# Patient Record
Sex: Female | Born: 2000 | Race: White | Hispanic: No | Marital: Single | Smoking: Never smoker
Health system: Southern US, Community
[De-identification: ages and names within clinical notes are randomized; demographics above are authoritative.]

## PROBLEM LIST (undated history)

## (undated) DIAGNOSIS — J020 Streptococcal pharyngitis: Secondary | ICD-10-CM

## (undated) DIAGNOSIS — F0781 Postconcussional syndrome: Secondary | ICD-10-CM

## (undated) DIAGNOSIS — F419 Anxiety disorder, unspecified: Secondary | ICD-10-CM

## (undated) DIAGNOSIS — J45909 Unspecified asthma, uncomplicated: Secondary | ICD-10-CM

## (undated) DIAGNOSIS — F32A Depression, unspecified: Secondary | ICD-10-CM

## (undated) HISTORY — DX: Unspecified asthma, uncomplicated: J45.909

## (undated) HISTORY — PX: TONSILLECTOMY: SUR1361

## (undated) HISTORY — DX: Streptococcal pharyngitis: J02.0

## (undated) HISTORY — PX: ADENOIDECTOMY: SUR15

---
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
## 2020-07-01 IMAGING — DX DG CHEST 1V PORT
1 series · 1 of 1 positions shown · non-contrast
Comparison: 07/18/2004

CLINICAL DATA: COVID positive short of breath

EXAM:
PORTABLE CHEST 1 VIEW

[chest ap]
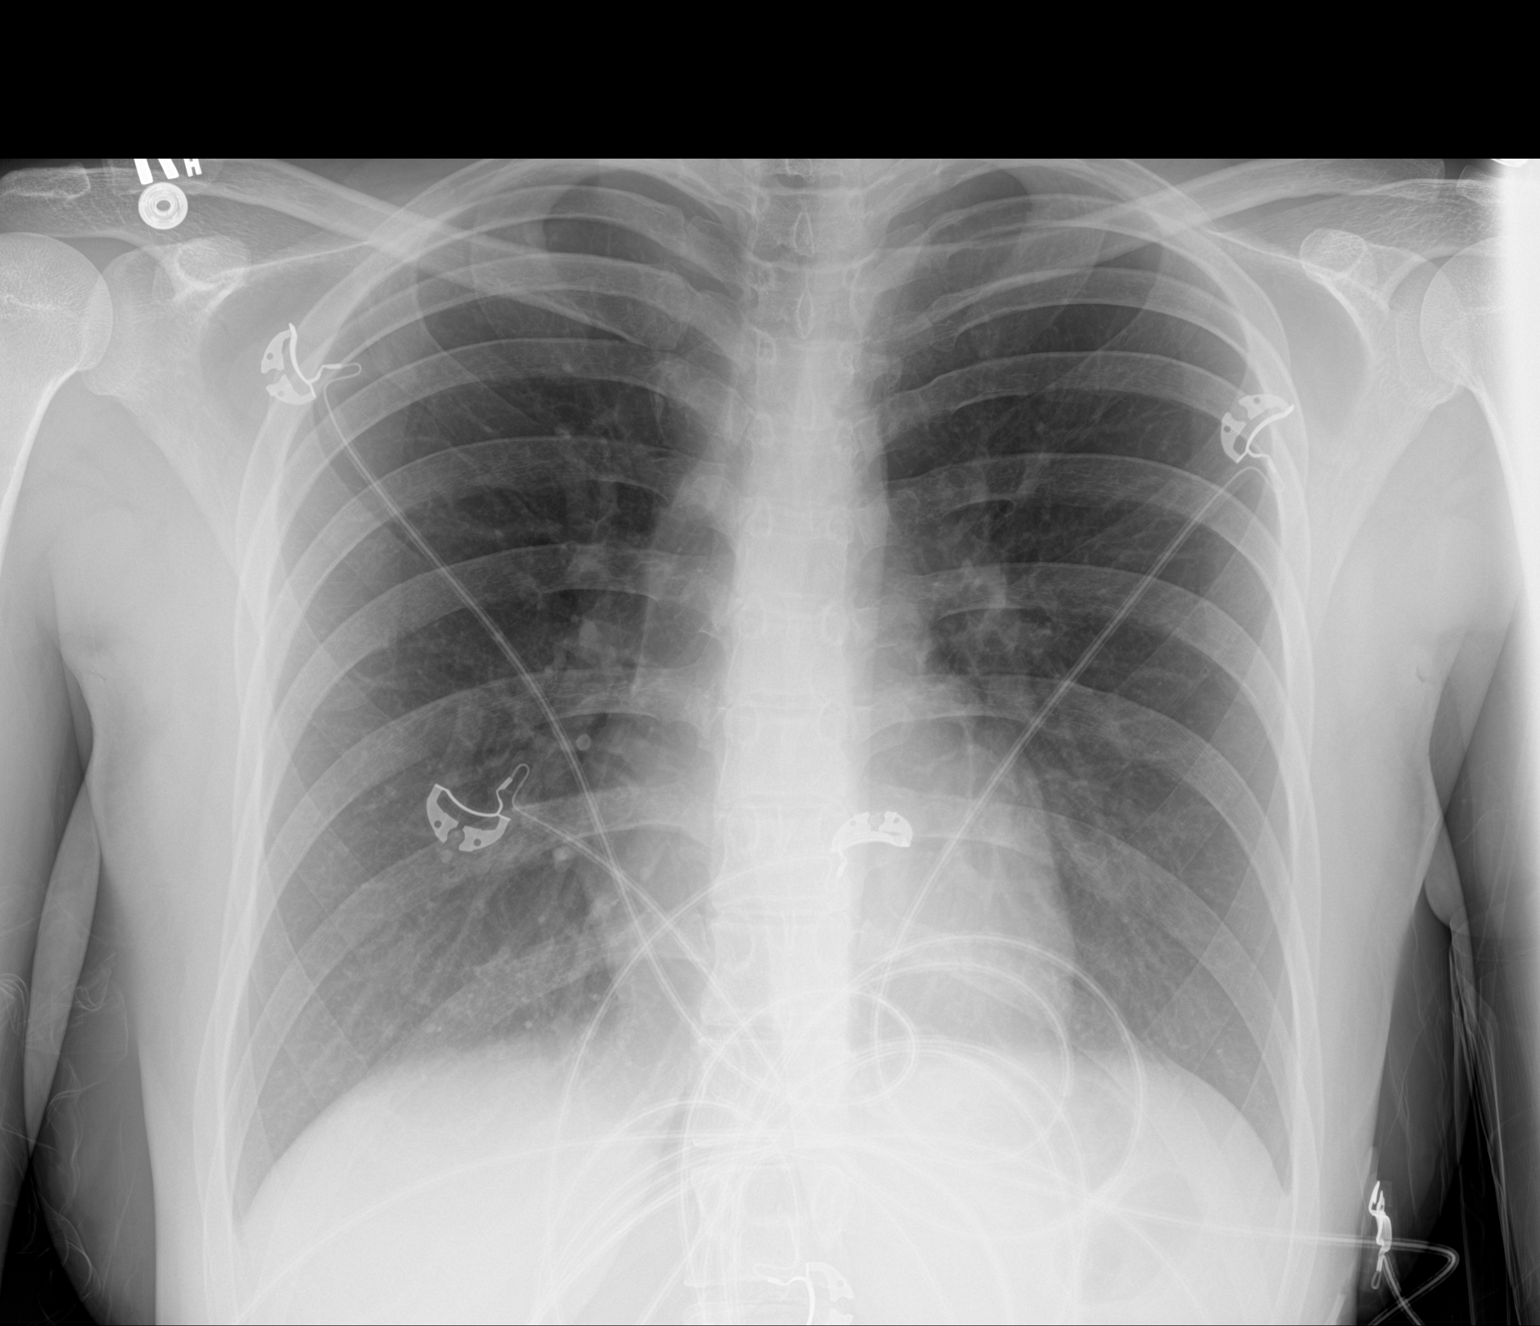

[1 of 1 positions shown; findings below may reference images not displayed]

FINDINGS: The heart size and mediastinal contours are within normal limits.
Both lungs are clear. The visualized skeletal structures are
unremarkable.
IMPRESSION: No active disease.

## 2020-07-10 ENCOUNTER — Encounter: Payer: Self-pay | Admitting: Family Medicine

## 2020-07-10 ENCOUNTER — Ambulatory Visit (INDEPENDENT_AMBULATORY_CARE_PROVIDER_SITE_OTHER): Payer: Managed Care, Other (non HMO) | Admitting: Family Medicine

## 2020-07-10 ENCOUNTER — Other Ambulatory Visit: Payer: Self-pay

## 2020-07-10 VITALS — BP 102/70 | Ht 64.0 in | Wt 130.0 lb

## 2020-07-10 DIAGNOSIS — S060X0A Concussion without loss of consciousness, initial encounter: Secondary | ICD-10-CM | POA: Diagnosis not present

## 2020-07-10 NOTE — Progress Notes (Addendum)
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  Jelesa initially sustained her concussion in early October, about 4 weeks ago, when she was at softball practice and someone accidentally hit her in the face with a softball bat.  It was a fairly traumatic injury and she lost multiple teeth which she had to have surgically reimplanted.  At that time she had an x-ray of her face which did not show any other fractures.  In the days following, she developed headache, nausea and some dizziness.  Initially was unclear whether this was secondary to the tooth injury and subsequent surgery, she continued to have persistent headache.  Since then, she has been managed at [employer] by the athletic training staff and by Dr. Micheline Chapman who has seen her on multiple occasions in the training room.  She has had regular evaluations with scat 5, which have continued to improve very slowly.  She has continued to have symptoms, most significantly headache, emotional lability, and some occasional dizziness.  The headache she describes as pain in the front of her forehead, sometimes radiates to the back but mostly is frontal.  She has had very slow improvement, however over the weekend had a worsening of symptoms which prompted her visit today.  Since then, patient has had return to her previous level of symptoms after resting on Monday and Tuesday.  She continues to have frontal headache and emotional lability which her mother confirms is different from her baseline.  She does not have any known history of depression or anxiety.  She is not on any chronic medications.  Of note, patient does have a history of concussion in high school, she reports that took about 2 months to return to baseline from that as well and that the blow to her head was much more mild than this was.   Review of Systems:  Per HPI.   [place], medications  and smoking status reviewed.      Objective:  Physical Exam:  [place] Adult Exercise 07/10/2020  Frequency of aerobic exercise (# of days/week) 5  Average time in minutes 60  Frequency of strengthening activities (# of days/week) 0     Gen: awake, alert, NAD, comfortable in exam room Pulm: breathing unlabored  Concussion PE  EOMI. PERRLA w/o pain.  H/X test/Smooth Pursuits: WNL, no nystagmus. No symptoms Horizontal Saccades: WNL without undershoot, overshoot. No symptoms.  Vertical Saccades: WNL without undershoot, overshoot. No symptoms.  Balance: Very mild excessive sway Runway Walk: WNL w/o symptoms. Palpation of forehead does elicit some pain, also very mild pain with palpation of cervical spine spinal musculature.    Assessment & Plan:  1.  Concussion  Patient with signs symptoms consistent with concussion.  Differential also includes frontal sinus or facial fracture in the setting of softball bat blow to the face, however x-rays were within normal limits and her symptoms are very consistent with concussion.  Given that she is having a lot of emotional instability as a primary symptom, will plan to refer for CBT.  Also discussed the possibility of PT and vestibular therapy, however it appears that her headache and emotional lability are the most significant symptoms for her so we will start with CBT.  I will plan to see her in training room in 2 weeks for reevaluation, sooner if concerns.   Dagoberto Ligas, MD [facility] Fellow 07/10/2020 3:01 PM  Addendum:  Patient seen in the office by fellow.  His history, exam, plan of care were precepted with me.  Karlton Lemon MD Kirt Boys

## 2020-07-15 ENCOUNTER — Encounter: Payer: Self-pay | Admitting: Psychology

## 2020-07-15 ENCOUNTER — Other Ambulatory Visit: Payer: Self-pay

## 2020-07-15 DIAGNOSIS — S060X0A Concussion without loss of consciousness, initial encounter: Secondary | ICD-10-CM

## 2020-07-25 ENCOUNTER — Encounter: Payer: Self-pay | Admitting: Psychology

## 2020-07-25 ENCOUNTER — Other Ambulatory Visit: Payer: Self-pay

## 2020-07-25 ENCOUNTER — Encounter: Payer: Managed Care, Other (non HMO) | Attending: Psychology | Admitting: Psychology

## 2020-07-25 DIAGNOSIS — F0781 Postconcussional syndrome: Secondary | ICD-10-CM | POA: Diagnosis present

## 2020-07-25 NOTE — Progress Notes (Addendum)
NEUROBEHAVIORAL STATUS EXAM  Name: Susan Matthews Date of Birth: Mar 10, 2001 Date of Interview: 07/25/2020  Reason for Referral:  Susan Matthews is a 19 y.o. female who is referred for psychological treatment (e.g., CBT) by Dr. Barbaraann Barthel of [place] due to disrupted mood and subjective cognitive changes associated with recent concussion; she had another concussion high school. This patient is accompanied in the office by her mother who supplements the history.   History of Presenting Problem: Patient is a 19 y.o. 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   Onset/Course: 06/17/20. Hit in the face/head with softball bat that slipped from the hands of an unknown individual while swinging during a game; she was spectator near [employer]. Lost 2 front teeth. Subsequent surgical reimplantation. X-ray of face performed at dentist did not show fracture.  In the days following, she developed headache, nausea and some dizziness.  Initially was unclear whether this was secondary to the tooth injury and subsequent surgery, she continued to have persistent headache. Managed at [employer] by athletic training staff, including Dr. Micheline Chapman. Balance and SCAT-5 testing have shown slow gradual improvement. She continues to report recurrent headache, emotional lability, and some occasional dizziness.  Pain related to headache is mostly localized to front of forehead and  sometimes radiating towards occipital area; mostly frontal.  She also describes feeling sluggish most days. She does not have any known history of depression or anxiety.  She is not on any chronic medications. She had a prior concussion in high school that reportedly took around 2 months to recover from; described as more mild.   Dr. Buena Irish found patient's signs/symptoms as mostly consistent with concussion. Frontal sinus or facial fracture were considered  but determined as less likely due to normal findings on x-ray. He identified emotional instability as a primary symptom, thus recommended psychological treatment in the form of CBT. Posibility of PT and vestibular therapy was also reportedly discussed with patient.   Upon direct questioning, the patient reported:   Forgetting recent conversations/events: Yes  Repeating statements/questions: Yes  Misplacing/losing items: Yes Forgetting appointments or other obligations: Denied  Forgetting to take medications: Denied   Difficulty concentrating: Yes  Starting but not finishing tasks: Yes; stop when symptoms are activated.  Distracted easily: Yes  Processing information more slowly: Yes  Word-finding difficulty: Denied Word substitutions: Denied Writing difficulty: Denied Spelling difficulty: Denied Comprehension difficulty: Denied  Getting lost when driving: Denied Making wrong turns when driving: Denied Uncertain about directions when driving or passenger: Denied  Family neuro hx: Unknown   Current Functioning: Work:  Regulatory affairs officer   Complex ADLs Driving: Able to perform IND without difficulty Medication management: Able to perform IND without difficulty Management of finances: Able to perform IND without difficulty Appointments: Able to perform IND without difficulty Cooking: Able to perform IND without difficulty  Medical/Physical complaints:  Any hx of stroke/TIA, MI, LOC/TBI, Sz? 2 concussions  Hx falls? Denied Balance, probs walking? Denied Sleep: Insomnia? OSA? CPAP? REM sleep beh sx? Denied Visual illusions/hallucinations? Appetite/Nutrition/Weight changes: Denied   Current mood: Depressed, frequent mood swings   Behavioral disturbance/Personality change: Denied  Suicidal Ideation/Intention: Denied  Psychiatric History: History of depression, anxiety, other MH disorder: Denied History of MH treatment: Denied History of SI: Denied History of  substance dependence/treatment: Denied  Social History: Born/Raised: [place], [place] Education: 60, Currently sophomore in college, estimated GPA 3.85 Occupational history: Ship broker; limited. Worked at [employer]  for 3 years.  Marital history: Single, never married Children: 0 Alcohol: Denied Tobacco: E-cigarette/vape daily SA: Denied  Medical History: Past Medical History:  Diagnosis Date  . Asthma   . Strep throat    Current Medications:  No outpatient encounter medications on file as of 07/25/2020.   No facility-administered encounter medications on file as of 07/25/2020.   Behavioral Observations:   Appearance: Neatly, casually and appropriately dressed and groomed Gait: Ambulated independently, no gross abnormalities observed Speech: Fluent; normal rate, rhythm and volume. No word finding difficulty. Thought process: Linear, goal directed and logical Affect: Full, anxious and depressed Interpersonal: Shy, polite and appropriate   TESTING: There is medical necessity to proceed with neuropsychological assessment as the results will be used to aid in differential diagnosis and clinical decision-making and to inform specific treatment and academic based recommendations. Per the patient, her mother, and medical records reviewed, there has been a change in emotional and cognitive function (e.g., short term memory), that is negatively impacting academic performance.   Clinical Decision Making: In considering the patient's current level of functioning, level of presumed impairment, nature of symptoms, emotional and behavioral responses during the interview, level of literacy, and observed level of motivation, a battery of tests was selected for patient to complete during scheduled testing appointment (08/12/20).    PLAN: The patient will return to complete the above referenced battery of neuropsychological testing with this provider during scheduled testing appointment (08/12/20). Results  will be used to inform selection of therapeutic interventions (e.g., consider strengths and weaknesses) and in developing individualized treatment plan to help return to baseline function.   Education regarding testing procedures was provided to the patient. Subsequently, the patient will see this provider for a follow-up session at which time her test performances and my impressions and treatment recommendations/plan will be reviewed in detail. She will be scheduled for around 8-12 individual therapy sessions. CBT or another evidence-based treatment will be used to help return to emotional and cognitive baseline.   Evaluation ongoing; full report to follow.

## 2020-07-31 NOTE — Addendum Note (Signed)
Addended by: Joelyn Oms R on: 07/31/2020 09:13 PM   Modules accepted: Level of Service

## 2020-08-12 ENCOUNTER — Encounter: Payer: Self-pay | Admitting: Psychology

## 2020-08-12 ENCOUNTER — Encounter: Payer: Managed Care, Other (non HMO) | Attending: Psychology | Admitting: Psychology

## 2020-08-12 ENCOUNTER — Other Ambulatory Visit: Payer: Self-pay

## 2020-08-12 DIAGNOSIS — F4323 Adjustment disorder with mixed anxiety and depressed mood: Secondary | ICD-10-CM | POA: Diagnosis present

## 2020-08-12 DIAGNOSIS — F0781 Postconcussional syndrome: Secondary | ICD-10-CM | POA: Diagnosis present

## 2020-08-12 NOTE — Progress Notes (Signed)
   Neuropsychology Note  Susan Matthews completed 240 minutes of neuropsychological testing with this provider. The patient did not appear overtly distressed by the testing session, per behavioral observation or via self-report. Rest breaks were offered.   Clinical Decision Making: In considering the patient's current level of functioning, level of presumed impairment, nature of symptoms, emotional and behavioral responses during the interview, level of literacy, and observed level of motivation/effort, a battery of tests was selected and completed during today's appointment. Changes were made as deemed necessary based on patient performance on testing, my observations observations and additional pertinent factors such as those listed above.  Susan Matthews will return within approximately 2 weeks for an interactive feedback session with this provider at which time her test performances, clinical impressions and treatment recommendations will be reviewed in detail. The patient understands she can contact our office should she require our assistance before this time.  Full report to follow.

## 2020-08-20 ENCOUNTER — Encounter (HOSPITAL_BASED_OUTPATIENT_CLINIC_OR_DEPARTMENT_OTHER): Payer: Managed Care, Other (non HMO) | Admitting: Psychology

## 2020-08-20 ENCOUNTER — Other Ambulatory Visit: Payer: Self-pay

## 2020-08-20 DIAGNOSIS — F4323 Adjustment disorder with mixed anxiety and depressed mood: Secondary | ICD-10-CM

## 2020-08-20 DIAGNOSIS — F0781 Postconcussional syndrome: Secondary | ICD-10-CM | POA: Diagnosis not present

## 2020-08-22 ENCOUNTER — Encounter: Payer: Self-pay | Admitting: Psychology

## 2020-08-22 ENCOUNTER — Other Ambulatory Visit: Payer: Self-pay

## 2020-08-22 ENCOUNTER — Encounter (HOSPITAL_BASED_OUTPATIENT_CLINIC_OR_DEPARTMENT_OTHER): Payer: Managed Care, Other (non HMO) | Admitting: Psychology

## 2020-08-22 DIAGNOSIS — F4323 Adjustment disorder with mixed anxiety and depressed mood: Secondary | ICD-10-CM

## 2020-08-22 DIAGNOSIS — F0781 Postconcussional syndrome: Secondary | ICD-10-CM | POA: Diagnosis not present

## 2020-08-22 NOTE — Progress Notes (Addendum)
[facility]    Alfonso Ellis, PsyD,  Clinical Neuropsychologist [address], [place], [place]  [address] Phone: [phone] Fax: [phone]   [place]! If you are not the patient, the patient's legal guardian/caregiver, or an individual for whom the patient has signed a release of information then do not proceed and contact the above mentioned provider. Thank you!   PATIENT:    Susan Matthews DATE OF BIRTH:   November 26, 2000 PROCEDURE:  Neuropsychological evaluation  DATE OF SERVICE:   08/20/2020 REFERRAL SOURCE:  Karlton Lemon, MD MEDICAL NECESSITY:  To evaluate cognitive and emotional functioning in light of recent concussion and subjective memory changes. . To monitor treatment efficacy and/or to assist with the management of the patient (i.e., start or continue rehab or pharmacological therapy)  SOURCES OF INFORMATION: The following information was gathered from a clinical interview with patient and mother, as well as review of available medical records. The patient expressed understanding of the purpose of the evaluation and consented to all procedures.   HISTORY OF PRESENT ILLNESS:   Reason for Referral:  Susan Matthews is a 18 y.o. female who was initially referred for psychological treatment (e.g., CBT) by Dr. Barbaraann Barthel of [place] due to disrupted mood and subjective cognitive changes associated with recent concussion; she had another concussion high school.   Given history of repeated head trauma and residual cognitive complaints including subjective memory changes, cognitive testing was deemed necessary to inform treatment planning and overall management of patient.    History of Presenting Problem: Patient is a19 [place].o[place] to initiate psychological treatment after recent  concussion.  Onset/Course: 06/17/20. Hit in the face/head with softball bat that slipped from the hands of an unknown individual while swinging during a game; she was spectator near [employer]. Lost 2 front teeth. Subsequent surgical reimplantation. X-ray of face performed at dentist did not show fracture. In the days following, she developed headache, nausea and some dizziness. Initially was unclear whether this was secondary to the tooth injury and subsequent surgery, she continued to have persistent headache. Managed at [employer] by athletic training staff, including Dr. Micheline Chapman. Balance and SCAT-5 testing have shown slow gradual improvement. She continues to report recurrent headache, emotional lability, and some occasional dizziness. Pain related to headache is mostly localized to front of forehead and  sometimes radiating towards occipital area; mostly frontal. She also describes feeling sluggish most days. She does not have any known history of depression or anxiety. She is not on any chronic medications. She had a prior concussion in high school that reportedly took around 2 months to recover from; described as more mild.   Dr. Buena Irish found patient's signs/symptoms as mostly consistent with concussion. Frontal sinus or facial fracture were considered but determined as less likely due to normal findings on x-ray. He identified emotional instability as a primary symptom, thus recommended psychological treatment in the form of CBT. Posibility of PT and vestibular therapy was also reportedly discussed with patient.   MEDICAL HISTORY: Birth and developmental history are benign.  Medical History:     Past Medical History:  Diagnosis Date  . Asthma   . Strep throat    Current Medications:  No outpatient encounter medications on file as of 08/20/2020.   PSYCHIATRIC HISTORY: Susan Matthews denied ever being diagnosed with or treated for a mental health disorder in the past.  However,  she did endorse experiencing prolonged periods of depression and anxiety in the past. .   There is no reported history of alcohol or illicit substance abuse/dependence. No suicidal/homicidal ideation, plan or intent endorsed. No manic or hypomanic episodes were reported. The patient denied ever experiencing any auditory/visual hallucinations. No behavioral or personality changes were endorsed.  FAMILY HISTORY:   They were unaware of any family history of neurological or movement disorders, neurodegenerative conditions, substance abuse, or psychiatric issues.  PSYCHOSOCIAL HISTORY:   There are no indications of learning or intellectual disability, or ADHD.    BEHAVIORAL OBSERVATIONS:   Susan Matthews was appropriately dressed for season and situation and appeared tidy and well-groomed. Stature and height were unremarkable. Patient appeared thin, fairly nourished, and chronological age. Sensory and motor abilities appeared normal. Patient was friendly and rapport was established. Speech was as expected. The patient was able to understand test directions. Mood was anxious and affect was mood congruent. Attention was variable and motivation was good. Insight was fair. Optimal test taking conditions were maintained.  VALIDITY OF EVALUATION: Scores on objective and embedded measures of performance validity were within normal limits, and there were no behavioral manifestations that suggested suboptimal effort or poor test engagement. As such, the following test results are considered valid and interpretable.  Mental Status: The patient was alert and fully oriented to person, place, time, and situation.  Attention and concentration were as expected.  Fund of information was typical.  Thinking was goal-directed and appeared normal from the perspective of productivity, relevance, and coherence with no preoccupations. The patient was able to form concepts well.  Judgment and decision-making appeared  intact.  Insight was full.    TEST RESULTS:   A detailed score summary is provided within the Scores Summary Table at the end of this report, presented as standardized scores obtained through comparisons of the patient's performance to that of same age peers taking into account (whenever possible) the effects of age, education, and other demographic factors.   Baseline Intellectual Abilities: Performance on a measure of word reading combined with demographic information yielded a statistically-derived estimate of premorbid intellectual functioning.  The patient's premorbid abilities are estimated to be in the below average to average range.   Attention, Processing Speed, and Executive Cognitive Processes: Response inhibition was relatively impaired as she made an excessive amount of errors (7 omissions). Performance improved when a switching component was added including less errors. Word naming speed was average. Color naming speed was average. Visuomotor cognitive information processing speed ranged from below average to average. Immediate auditory attention was mildly impaired and a notable weakness, working memory capacity was average, and mental sequencing was below average. Visual working memory was average. Sustained visual attention was below expectation and mildly impaired. Psychomotor speed involving simple sequencing and visual scanning was below average. More complex sequencing requiring divided attention, mental flexibility, and set shifting was mildly impaired. The patient's ability to identify, maintain, and shift problem solving strategies in response to examiner feedback was average.   Language Functions: The patient's speech was fluent and grammar and syntax were unremarkable. No major word-finding  problems were observed. Speech comprehension appeared intact. Letter fluency was below average. Category fluency was average.  Visuospatial and Constructional Abilities: Spatial judgment was  average. Complex constructional praxis was intact.   Learning and Memory:  On a rote verbal list-learning task, overall verbal learning was above average. Short delayed free recall was average. Long-delayed free recall was above average.Verbal recognition was also above  average. Immediate recall of auditorially-processed prose material was average. Long-delayed contextual free recall was also average; recognition was average. On a test measuring visuospatial memory, immediate and long-delayed free recall were above average. Visual recognition was average.   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 She rated trouble falling asleep, and difficulty remembering as her most severe symptoms.  Difficulty concentrating, nervousness, headaches, fatigue, loss of sleep, and light sensitivity were rated as moderate. Dizziness, drowsiness, feeling slow, and visual problems were rated as mild.    SUMMARY & IMPRESSION:   Clinical Impressions: Postconcussional syndrome;   Results of cognitive testing are generally within normal limits and consistent with predicted level of functioning. Relative weaknesses, which may represent mild decrements from premorbid functioning, are noted in basic auditory attention, vigilance, and inhibition.  Mild-moderate residual symptoms associated with  postconcussional syndrome were endorsed. Based on the testing results, there is no basis for a diagnosis of a cognitive disorder. However, there does appear to be a slight (non-statistically significant) pattern of weakness suggesting left frontal area might be more impacted given mildly impaired fine motor coordination in dominant hand (Rt.), decreased lexical fluency, and basic attention; to be explored further. Nevertheless, the constellation of subjective cognitive complaints, physiological symptoms and  emotional changes following her concussion is consistent with a diagnosis of postconcussional syndrome (PCS). While her neuropsychological performance does not necessarily rule out the possibility of some mild and/or transient cognitive problems, it does provide strong evidence against any very significant impairment. Symptoms of anxiety and depression are likely contributing to her functional cognitive complaints to a mild-moderate degree. Thus, I am hopeful that intervention for mood, as well as education regarding PCS, will improve her overall wellbeing.   Recommendations/Plan: 1. TREATMENT FOR MOOD/ANXIETY:  CBT targeted at postconcussional syndrome symptoms is specifically recommended Cecille Rubin, S., & Owens Shark, R. (2012).  Therein the patient can work on identifying triggers for depression and anxiety as well as work towards developing healthy coping mechanisms and implementing lifestyle modifications that can help to support ongoing mental health.    2.          OPTOMETRY OR OPHTHALMOLOGY: Given some evidence for residual vision problems and symptoms following concussion (e.g., headaches, dizziness, sensitivity to light, blurry vision), she may benefit from an optometry or ophthalmology for a comprehensive eye exam to determine whether vision therapy is recommended. If deemed appropriate targeted visual (oculomotor) exercises may help repair damaged connections, returning them to normal.   3. EDUCATION REGARDING PCS: The patient will be educated regarding PCS and the importance of treating depression/anxiety and improving coping skills for other symptoms. She will be provided with detailed, written information regarding PCS.   4. Nutritional factors can have a significant effect on psychological and emotional status, as well as overall brain functioning.  The following general recommendations have been associated with improvements in depression and other psychological symptoms, as well as lower risk  for other forms of cognitive impairment.  Please discuss these recommendations with your physician and/or dietitian before initiating:  . Consume a wide variety of fresh fruits and vegetables, particularly including brightly colored items such as berries, oranges, tomatoes, peppers, carrots, broccoli, spinach, dark green lettuces, sweet potatoes, etc., all of which are high in vitamins and antioxidants.  . Consume foods that are high in fiber, such as legumes (e.g., beans, peas, lentils) and foods made from whole grains (e.g., whole wheat  bread and pasta)  . Consume a significant amount of omega-3 essential fats and oils.  These can be found in natural food sources such as salmon and other fatty fish, and also products made from flax seed and flax seed oil.  Alternately, dietary supplementation with fish oil capsules and flax seed oil capsules is a good way to boost one's level of omega-3 consumption.  It is important to check with your doctor before taking these supplements, especially if you take blood thinning medication.  . If you do not already do so, consider taking a quality multivitamin supplement under the guidance of your primary care physician.   . Consider keeping consumption of the following foods to a minimum: 1) foods made from white flour and white sugar; 2) artificial sweeteners; 3) deep-fried foods; 4) animal fat other than fish; 5) any foods containing "hydrogenated" or "trans" fats; 6) most other types of highly processed packaged/prepared foods.   5. The following are several strategies that may help:  . Performance will generally be best in a structured, routine, and familiar environment, as opposed to situations involving complex problems.  Marlene Lard a place to keep your keys, wallet, cell phone, and other personal belongings. . Take time to register and process information to be remembered. Deeper encoding of information can be gained by forming a mental picture, making  meaningful associations, connecting new information to previously learned and related information, paraphrasing and repetition.  . To the extent possible, multitasking should be avoided; break down tasks into smaller steps to help get started and to keep from feeling overwhelmed. And if there are difficulties in organization and planning, maintaining a daily organizer to help keep track of important appointments and information may be beneficial.   . Memory problems may at least be minimally addressed using compensatory strategies such as the use of a daily schedule to follow, memos, portable recorder, a centrally located bulletin board, or memory notebook. A large calendar, placed in a highly visible location would be valuable to keep track of dates and appointments.  In addition, it would be helpful to keep a log of all of medical appointments with the name of the doctor, date of visit, diagnoses, and treatments.  . Use of a medication box is recommended to ensure compliance and decrease confusion regarding medication dosages, times, and dates. . To aid in managing problems with attention, the patient may consider using some of the following strategies: o The patient should simplify tasks.  There may be a need to break overly complex activities into simple step-by-step tasks, keep these steps written down in a note book and then check them off as they are completed which will help to stay on task and make sure the whole task is finished.  o The patient should set deadlines for everything, even for seemingly small tasks, prioritize time-sensitive tasks and write down every assignment, message, or important thought. o The patient is encouraged to use timers and alarms to stay on track and take breaks at regular intervals. Avoid piles of paperwork or procrastination by dealing with each item as it comes in.  6. Considering the relative weakness noted in basic attention, aspects of executive function, fine  motor coordination, and residual PCS symptoms that interfere with cognition, documented in this evaluation, it is recommended that Susan Matthews be given an extension on all final projects and exams associated with the current semester (Winter 2021). She should also receive standard accommodations upon her return to college , including  specific accommodations such as extra time for examinations, permission to take exams alone in a quiet place, permission to tape-record lectures, provision of outlines of material to be covered prior to lectures, etc.  If you have any questions, please contact us at [phone].   This report is intended solely for the confidential review and use by the referring professional to assist in diagnostic and medical decision making needs.  This report should not be released to a third party without proper consent. [NOTE: data can be made available to qualified professionals with permission from the patient or legal representative/caregiver]   REFERRING DIAGNOSIS:  Memory changes  FINAL DIAGNOSES (ICD-10 considerations):  Post-concussional syndrome [F07.81]; Adjustment disorder with mixed anxiety and depressed mood [F43.23]   ____________________________________ Alfonso Ellis, PsyD,  Licensed Psychologist (Provisional) Clinical Neuropsychologist     TEST SCORES:  Note: This summary of test scores accompanies the interpretive report and should not be considered in isolation without reference to the appropriate sections in the text. Descriptors are based on appropriate normative data and may be adjusted based on clinical judgment. The terms "impaired" and "within normal limits (WNL)" are used when a more specific level of functioning cannot be determined.    Validity Testing:     Descriptor         WAIS-IV Reliable Digit Span: --- --- Within Expectation  CVLT-III Forced Choice Recognition: --- --- -  RCFT Combination Effort Score: --- --- Within Expectation          Intellectual Functioning:                 Standard Score Percentile    Test of Premorbid Functioning: 86 18 Below Average         Wechsler Adult Intelligence Scale (WAIS-IV):  Standard Score/ Scaled Score Percentile    Verbal Comprehension Index: - - -  Similarities  8 25 Average  Information  6 9 Below Average  Perceptual Reasoning Index:  - - -  Matrix Reasoning  8 25 Average  Visual Puzzles 7 16 Below Average  Working Memory Index: - - -  Digit Span 7 16 Below Average  Processing Speed Index: 86 18 Below Average  Symbol Search  7 16 Below Average  Coding 8 25 Average         Wechsler Adult Intelligence Scale (WAIS-IV) Short Form*: Standard Score/ Scaled Score Percentile    Full Scale IQ  81 10 Below Average  Similarities 8 25 Average  Information  6 9 Below Average  Visual Puzzles 7 16 Below Average  Digit Span 7 16 Below Average  Coding 8 25 Average  *From [employer] (2009)             Memory:               Wechsler Memory Scale (WMS-IV):                       Raw Score (Scaled Score) Percentile    Logical Memory I 29/50 (11) 63 Average  Logical Memory II 26/50 (11) 63 Average  Logical Memory Recognition 26/30 51-75 Average         [place] Verbal Learning Test (CVLT-III), Standard Form: Raw Score (Scaled/Standard Score) Percentile    Total Trials 1-5 63/80 (115) 84 Above Average  List B 7/16 (13) 84 Above Average  Short-Delay Free Recall 12/16 (11) 63 Average  Short-Delay Cued Recall 12/16 (10) 50 Average  Long Delay Free  Recall 14/16 (13) 84 Above Average  Long Delay Cued Recall 14/16 (13) 84 Above Average  Discriminability 4.0 (13) 84 Above Average  Recognition Hits 16/16 (13) 84 Above Average  False Positive Errors  (12) 75 Above Average         Rey-Osterrieth Complex Figure Test (RCFT): Raw Score (T Score) Percentile    Immediate Recall 28.5/36 (59) 82 Above Average  Delayed Recall 29/36 (59) 82 Above Average  Recognition Total Correct  22/24 (52) 58 Average  True Positives 10 >16 Within Normal Limits  False Positives 0 >16 Within Normal Limits         Attention/Executive Function:               Trail Making Test (TMT): Raw Score (T-Score) Percentile    Part A 29 secs., 0 errors (38) 12 Below Average  Part B 82 secs., 1error (35) 7 Mildly Impaired            Scaled Score Percentile    WAIS-IV Coding: 8 25 Average           Scaled Score Percentile    WAIS-IV Digit Span: 7 16 Below Average  Forward 5 5 Well Below Average  Backward 9 37 Average  Sequencing 7 16 Below Average         CPT T Score Percentile    Hits 135 - Average  Misses 13 - Below Expectation  Omissions 15 - Below Expectation  Variability - - Below Expectation           Age-Scaled Score Percentile    Wechsler Memory Scale (WMS-IV) Symbol Span: 8 25 Average           Scaled Score Percentile    WAIS-IV Similarities: 8 25 Average         D-KEFS Color-Word Interference Test: Scaled Score Percentile    Color Naming  11 63 Average  Word Reading  11 63 Average  Inhibition 12 75 Average  Total Errors 7 errors (3) 1 Exceptionally Low  Inhibition/Switching 14 91 Above Average  Total Errors 4 errors (9) 37 Average         [place] Card Sorting Test: Raw Score Percentile    Categories (trials) 6 (12) - >16  Total Errors 18 66 Average  % Perseverative Errors 10 50 Average  % Non-Perseverative Errors 8 62 Average  Failure to Maintain Set 0 - >16         Language:               Verbal Fluency Test: Raw Score (T-Score) Percentile    Phonemic Fluency (FAS) 34 (39) 14 Below Average  Animal Fluency 28 (56) 73 Average         Visuospatial/Visuoconstruction:          Raw Score Percentile    RCFT, Copy: 36/36 >16 Within Normal Limits    Scaled Score Percentile    WAIS-IV Matrix Reasoning: 8 25 Average   WAIS-IV Visual Puzzles 8 25 Average       Sensory-Motor:               [place] Grooved Pegboard Test: Raw Score (T-Score) Percentile     Dominant Hand (Rt.) 73 secs., 0 drops (33)  5 Mildly Impaired  Non-Dominant Hand 68 secs., 1 drops (42)  21 Average         Additional Questionnaires:               Post-Concussion Symptom Scale: Raw Score  Percentile    Total Score 38/132 --- Mild-Moderate  Headaches 3 --- Moderate  Nausea 0 --- None  Balance Problems 0 --- None  Dizziness 2 --- Mild  Fatigue 3 --- Moderate  Trouble Falling to Sleep 4 --- Moderate  Excessive Sleep 0 --- None  Loss of Sleep 3 --- Moderate  Drowsiness  2 --- Mild  Light Sensitivity 3 --- Moderate  Noise Sensitivity 0 --- None  Irritability 1 --- Mild  Sadness 1 --- Mild  Nervousness 3 --- Moderate  More Emotional 1 --- Mild  Numbness 0 --- None  Feeling "Slow" 2  --- Mild  Feeling "Foggy" 1 --- Mild  Difficulty Concentration  3 --- Moderate  Difficulty Remembering  4 --- Moderate  Visual Problems 2 --- Mild          Billing/Service Summary:   Neurobehavioral Status Exam:  Base: [id number] Add-on: [id number]  Direct clinical assessment (interview) of the patient and collateral interviews (as appropriate) by the licensed psychologist                                                                                      Total time: 120 minutes       Total units:  1 1  Neuropsychological Testing Evaluation Services:   Base: [id number] Add-on: [id number]  Records review & clarify referral question; Patient symptom management; clinical decision making/battery modification; Integration/report generation; and, post-service work   Total time:  120 minutes  Total units:  1 1  Designer, fashion/clothing by Psychologist:  Base: [id number] Add-on: [phone]  Test Administration (face-to-face) Scoring (Non-face-to-face)                                                                                       Total time: 240 minutes      Total units:  1 7

## 2020-08-23 NOTE — Progress Notes (Addendum)
Neuropsychology Feedback Appointment  Susan Matthews and her mother returned for a feedback appointment today to review the results of her recent neuropsychological evaluation with this provider. 60 minutes face-to-face time was spent reviewing her test results, my impressions and my recommendations as detailed in her report. Education was provided about postconcussional syndrome and adjustment disorder with mixed anxiety and depressed mood. The patient and her mother were given the opportunity to ask questions, and I did my best to answer these to their satisfaction.    We scheduled first CBT session for 09/05/20. Plan is to help patient develop the ability to identify negative thought patterns that contribute to her individual difficulties and teache concrete skills that she can use to help manage them. Anticipated length of treatment is 8-12 individual therapy sessions (60min); will assess progress every 4 visits.  Below you can find the summary and impressions/diagnoses from the formal evaluation.  The complete report can be found in the patient's medical records dated 08/20/2020 and the full history and initial intake information can be found dated 07/25/2020.   TEST RESULTS:   A detailed score summary is provided within the Scores Summary Table at the end of this report, presented as standardized scores obtained through comparisons of the patient's performance to that of same age peers taking into account (whenever possible) the effects of age, education, and other demographic factors.   Baseline Intellectual Abilities: Performance on a measure of word reading combined with demographic information yielded a statistically-derived estimate of premorbid intellectual functioning.  The patient's premorbid abilities are estimated to be in the below average to average range.   Attention, Processing Speed, and Executive Cognitive Processes: Response inhibition was relatively impaired as she  made an excessive amount of errors (7 omissions). Performance improved when a switching component was added including less errors. Word naming speed was average. Color naming speed was average. Visuomotor cognitive information processing speed ranged from below average to average. Immediate auditory attention was mildly impaired and a notable weakness, working memory capacity was average, and mental sequencing was below average. Visual working memory was average. Sustained visual attention was below expectation and mildly impaired. Psychomotor speed involving simple sequencing and visual scanning was below average. More complex sequencing requiring divided attention, mental flexibility, and set shifting was mildly impaired. The patient's ability to identify, maintain, and shift problem solving strategies in response to examiner feedback was average.   Language Functions: The patient's speech was fluent and grammar and syntax were unremarkable. No major word-finding  problems were observed. Speech comprehension appeared intact. Letter fluency was below average. Category fluency was average.  Visuospatial and Constructional Abilities: Spatial judgment was average. Complex constructional praxis was intact.   Learning and Memory: On a rote verbal list-learning task, overall verbal learning was above average. Short delayed free recall was average. Long-delayed free recall was above average.Verbal recognition was also above average. Immediate recall of auditorially-processed prose material was average. Long-delayed contextual free recall was also average; recognition was average. On a test measuring visuospatial memory, immediate and long-delayed free recall were above average. Visual recognition was average.   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 She rated trouble falling  asleep, and difficulty remembering as her most severe symptoms.  Difficulty concentrating, nervousness, headaches, fatigue, loss of sleep, and light sensitivity were rated as moderate. Dizziness, drowsiness, feeling slow, and visual problems were rated as mild.  SUMMARY & IMPRESSION:   Clinical Impressions: Postconcussional syndrome;  Adjustment disorder with mixed anxiety and depressed mood  Results of cognitive testing are generally within normal limits and consistent with predicted level of functioning. Relative weaknesses, which may represent mild decrements from premorbid functioning, are noted in basic auditory attention, vigilance, and inhibition.  Mild-moderate residual symptoms associated with  postconcussional syndrome were endorsed. Based on the testing results, there is no basis for a diagnosis of a cognitive disorder. However, there does appear to be a slight (non-statistically significant) pattern of weakness suggesting left frontal area might be more impacted given mildly impaired fine motor coordination in dominant hand (Rt.), decreased lexical fluency, and basic attention; to be explored further. Nevertheless, the constellation of subjective cognitive complaints, physiological symptoms and emotional changes following her concussion is consistent with a diagnosis of postconcussional syndrome (PCS). While her neuropsychological performance does not necessarily rule out the possibility of some mild and/or transient cognitive problems, it does provide strong evidence against any very significant impairment. Symptoms of anxiety and depression are likely contributing to her functional cognitive complaints to a mild-moderate degree. Thus, I am hopeful that intervention for mood, as well as education regarding PCS, will improve her overall wellbeing.   Recommendations/Plan: 1. TREATMENT FOR MOOD/ANXIETY:  CBT targeted at postconcussional syndrome symptoms is specifically recommended Cecille Rubin,  S., & Owens Shark, R. (2012).  Therein the patient can work on identifying triggers for depression and anxiety as well as work towards developing healthy coping mechanisms and implementing lifestyle modifications that can help to support ongoing mental health.    2. OPTOMETRY OR OPHTHALMOLOGY: Given some evidence for residual vision problems and symptoms following concussion (e.g., headaches, dizziness, sensitivity to light, blurry vision), she may benefit from an optometry or ophthalmology for a comprehensive eye exam to determine whether vision therapy is recommended. If deemed appropriate targeted visual (oculomotor) exercises may help repair damaged connections, returning them to normal.   3. EDUCATION REGARDING PCS: The patient will be educated regarding PCS and the importance of treating depression/anxiety and improving coping skills for other symptoms. She will be provided with detailed, written information regarding PCS.   4. Nutritional factors can have a significant effect on psychological and emotional status, as well as overall brain functioning.  The following general recommendations have been associated with improvements in depression and other psychological symptoms, as well as lower risk for other forms of cognitive impairment.  Please discuss these recommendations with your physician and/or dietitian before initiating:   . Consume a wide variety of fresh fruits and vegetables, particularly including brightly colored items such as berries, oranges, tomatoes, peppers, carrots, broccoli, spinach, dark green lettuces, sweet potatoes, etc., all of which are high in vitamins and antioxidants.  . Consume foods that are high in fiber, such as legumes (e.g., beans, peas, lentils) and foods made from whole grains (e.g., whole wheat bread and pasta)  . Consume a significant amount of omega-3 essential fats and oils.  These can be found in natural food sources such as salmon and other fatty fish, and also  products made from flax seed and flax seed oil.  Alternately, dietary supplementation with fish oil capsules and flax seed oil capsules is a good way to boost one's level of omega-3 consumption.  It is important to check with your doctor before taking these supplements, especially if you take blood thinning medication.  . If you do not already do so, consider taking a quality multivitamin supplement under the guidance of your  primary care physician.   . Consider keeping consumption of the following foods to a minimum: 1) foods made from white flour and white sugar; 2) artificial sweeteners; 3) deep-fried foods; 4) animal fat other than fish; 5) any foods containing "hydrogenated" or "trans" fats; 6) most other types of highly processed packaged/prepared foods.   5. The following are several strategies that may help:  . Performance will generally be best in a structured, routine, and familiar environment, as opposed to situations involving complex problems.  Marlene Lard a place to keep your keys, wallet, cell phone, and other personal belongings. . Take time to register and process information to be remembered. Deeper encoding of information can be gained by forming a mental picture, making meaningful associations, connecting new information to previously learned and related information, paraphrasing and repetition.  . To the extent possible, multitasking should be avoided; break down tasks into smaller steps to help get started and to keep from feeling overwhelmed. And if there are difficulties in organization and planning, maintaining a daily organizer to help keep track of important appointments and information may be beneficial.   . Memory problems may at least be minimally addressed using compensatory strategies such as the use of a daily schedule to follow, memos, portable recorder, a centrally located bulletin board, or memory notebook. A large calendar, placed in a highly visible location would be  valuable to keep track of dates and appointments.  In addition, it would be helpful to keep a log of all of medical appointments with the name of the doctor, date of visit, diagnoses, and treatments.  . Use of a medication box is recommended to ensure compliance and decrease confusion regarding medication dosages, times, and dates. . To aid in managing problems with attention, the patient may consider using some of the following strategies: o The patient should simplify tasks.  There may be a need to break overly complex activities into simple step-by-step tasks, keep these steps written down in a note book and then check them off as they are completed which will help to stay on task and make sure the whole task is finished.  o The patient should set deadlines for everything, even for seemingly small tasks, prioritize time-sensitive tasks and write down every assignment, message, or important thought. o The patient is encouraged to use timers and alarms to stay on track and take breaks at regular intervals. Avoid piles of paperwork or procrastination by dealing with each item as it comes in.  6. Considering the relative weakness noted in basic attention, aspects of executive function, fine motor coordination, and residual PCS symptoms that interfere with cognition, documented in this evaluation, it is recommended that Ms. Villescas be given an extension on all final projects and exams associated with the current semester (Winter, 2021). She should also receive standard accommodations upon her return to college , including specific accommodations such as extra time for examinations, permission to take exams alone in a quiet place, permission to tape-record lectures, provision of outlines of material to be covered prior to lectures, etc.  If you have any questions, please contact us at [phone].   This report is intended solely for the confidential review and use by the referring professional to  assist in diagnostic and medical decision making needs.  This report should not be released to a third party without proper consent. [NOTE: data can be made available to qualified professionals with permission from the patient or legal representative/caregiver]   REFERRING DIAGNOSIS:  Memory changes  FINAL DIAGNOSES (ICD-10 considerations):  Post-concussional syndrome [F07.81]; Adjustment disorder with mixed anxiety and depressed mood [F43.23]   ____________________________________ Alfonso Ellis, PsyD,  Licensed Psychologist (Provisional) Clinical Neuropsychologist     TEST SCORES:  Note: This summary of test scores accompanies the interpretive report and should not be considered in isolation without reference to the appropriate sections in the text. Descriptors are based on appropriate normative data and may be adjusted based on clinical judgment. The terms "impaired" and "within normal limits (WNL)" are used when a more specific level of functioning cannot be determined.    Validity Testing:     Descriptor         WAIS-IV Reliable Digit Span: --- --- Within Expectation  CVLT-III Forced Choice Recognition: --- --- -  RCFT Combination Effort Score: --- --- Within Expectation         Intellectual Functioning:                 Standard Score Percentile    Test of Premorbid Functioning: 86 18 Below Average         Wechsler Adult Intelligence Scale (WAIS-IV):  Standard Score/ Scaled Score Percentile    Verbal Comprehension Index: - - -  Similarities  8 25 Average  Information  6 9 Below Average  Perceptual Reasoning Index:  - - -  Matrix Reasoning  8 25 Average  Visual Puzzles 7 16 Below Average  Working Memory Index: - - -  Digit Span 7 16 Below Average  Processing Speed Index: 86 18 Below Average  Symbol Search  7 16 Below Average  Coding 8 25 Average         Wechsler Adult Intelligence Scale (WAIS-IV) Short Form*: Standard Score/ Scaled Score Percentile    Full Scale  IQ  81 10 Below Average  Similarities 8 25 Average  Information  6 9 Below Average  Visual Puzzles 7 16 Below Average  Digit Span 7 16 Below Average  Coding 8 25 Average  *From [employer] (2009)             Memory:               Wechsler Memory Scale (WMS-IV):                       Raw Score (Scaled Score) Percentile    Logical Memory I 29/50 (11) 63 Average  Logical Memory II 26/50 (11) 63 Average  Logical Memory Recognition 26/30 51-75 Average         [place] Verbal Learning Test (CVLT-III), Standard Form: Raw Score (Scaled/Standard Score) Percentile    Total Trials 1-5 63/80 (115) 84 Above Average  List B 7/16 (13) 84 Above Average  Short-Delay Free Recall 12/16 (11) 63 Average  Short-Delay Cued Recall 12/16 (10) 50 Average  Long Delay Free Recall 14/16 (13) 84 Above Average  Long Delay Cued Recall 14/16 (13) 84 Above Average  Discriminability 4.0 (13) 84 Above Average  Recognition Hits 16/16 (13) 84 Above Average  False Positive Errors  (12) 75 Above Average         Rey-Osterrieth Complex Figure Test (RCFT): Raw Score (T Score) Percentile    Immediate Recall 28.5/36 (59) 82 Above Average  Delayed Recall 29/36 (59) 82 Above Average  Recognition Total Correct 22/24 (52) 58 Average  True Positives 10 >16 Within Normal Limits  False Positives 0 >16 Within Normal Limits  Attention/Executive Function:               Trail Making Test (TMT): Raw Score (T-Score) Percentile    Part A 29 secs., 0 errors (38) 12 Below Average  Part B 82 secs., 1error (35) 7 Mildly Impaired            Scaled Score Percentile    WAIS-IV Coding: 8 25 Average           Scaled Score Percentile    WAIS-IV Digit Span: 7 16 Below Average  Forward 5 5 Well Below Average  Backward 9 37 Average  Sequencing 7 16 Below Average         CPT T Score Percentile    Hits 135 - Average  Misses 13 - Below Expectation  Omissions 15 - Below Expectation  Variability - - Below Expectation            Age-Scaled Score Percentile    Wechsler Memory Scale (WMS-IV) Symbol Span: 8 25 Average           Scaled Score Percentile    WAIS-IV Similarities: 8 25 Average         D-KEFS Color-Word Interference Test: Scaled Score Percentile    Color Naming  11 63 Average  Word Reading  11 63 Average  Inhibition 12 75 Average  Total Errors 7 errors (3) 1 Exceptionally Low  Inhibition/Switching 14 91 Above Average  Total Errors 4 errors (9) 37 Average         [place] Card Sorting Test: Raw Score Percentile    Categories (trials) 6 (12) - >16  Total Errors 18 66 Average  % Perseverative Errors 10 50 Average  % Non-Perseverative Errors 8 62 Average  Failure to Maintain Set 0 - >16         Language:               Verbal Fluency Test: Raw Score (T-Score) Percentile    Phonemic Fluency (FAS) 34 (39) 14 Below Average  Animal Fluency 28 (56) 73 Average         Visuospatial/Visuoconstruction:          Raw Score Percentile    RCFT, Copy: 36/36 >16 Within Normal Limits    Scaled Score Percentile    WAIS-IV Matrix Reasoning: 8 25 Average   WAIS-IV Visual Puzzles 8 25 Average       Sensory-Motor:               [place] Grooved Pegboard Test: Raw Score (T-Score) Percentile    Dominant Hand (Rt.) 73 secs., 0 drops (33)  5 Mildly Impaired  Non-Dominant Hand 68 secs., 1 drops (42)  21 Average         Additional Questionnaires:               Post-Concussion Symptom Scale: Raw Score Percentile    Total Score 38/132 --- Mild-Moderate  Headaches 3 --- Moderate  Nausea 0 --- None  Balance Problems 0 --- None  Dizziness 2 --- Mild  Fatigue 3 --- Moderate  Trouble Falling to Sleep 4 --- Moderate  Excessive Sleep 0 --- None  Loss of Sleep 3 --- Moderate  Drowsiness  2 --- Mild  Light Sensitivity 3 --- Moderate  Noise Sensitivity 0 --- None  Irritability 1 --- Mild  Sadness 1 --- Mild  Nervousness 3 --- Moderate  More Emotional 1 --- Mild  Numbness 0 --- None  Feeling "Slow" 2  --- Mild  Feeling "Foggy" 1 --- Mild  Difficulty Concentration  3 --- Moderate  Difficulty Remembering  4 --- Moderate  Visual Problems 2 --- Mild          Billing/Service Summary:   Neurobehavioral Status Exam:  Base: [id number] Add-on: [id number]  Direct clinical assessment (interview) of the patient and collateral interviews (as appropriate) by the licensed psychologist                                                                                      Total time: 120 minutes       Total units:  1 1  Neuropsychological Testing Evaluation Services:   Base: [id number] Add-on: [id number]  Records review & clarify referral question; Patient symptom management; clinical decision making/battery modification; Integration/report generation; and, post-service work   Total time:  120 minutes  Total units:  1 1  Designer, fashion/clothing by Psychologist:  Base: [id number] Add-on: [phone]  Test Administration (face-to-face) Scoring (Non-face-to-face)                                                                                       Total time: 240 minutes      Total units:  1 7

## 2020-09-05 ENCOUNTER — Encounter: Payer: Managed Care, Other (non HMO) | Admitting: Psychology

## 2020-09-18 ENCOUNTER — Other Ambulatory Visit: Payer: Self-pay

## 2020-09-18 ENCOUNTER — Encounter: Payer: Managed Care, Other (non HMO) | Attending: Psychology | Admitting: Psychology

## 2020-09-18 DIAGNOSIS — F4323 Adjustment disorder with mixed anxiety and depressed mood: Secondary | ICD-10-CM | POA: Diagnosis not present

## 2020-09-18 DIAGNOSIS — F0781 Postconcussional syndrome: Secondary | ICD-10-CM | POA: Diagnosis not present

## 2020-09-23 ENCOUNTER — Encounter: Payer: Self-pay | Admitting: Psychology

## 2020-09-23 NOTE — Progress Notes (Signed)
   THERAPIST PROGRESS NOTE  Session # 1  Time: 60 min  Participation Level: Active  Behavioral Response: Casual, Guarded and Well GroomedAlertAnxious and Dysphoric  Type of Therapy: Individual Therapy  Treatment Goals addressed: Anxiety, Coping and Diagnosis: Post-concussional syndrome and adjustment disorder with mixed anxiety and depression  Interventions: CBT and Solution Focused  Summary: Susan Matthews is a 20 y.o. female who presents with post-concussional syndrome and adjustment disorder with mixed anxiety and depression.   She was hit in the face/head with softball bat that slipped from the hands of an unknown individual while swinging during a game; she was spectator near [employer].Lost 2 front teeth. Subsequentsurgical reimplantation.X-ray of face performed at dentistdid not show fracture.In the days following, she developed headache, nausea and some dizziness. 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 Micheline Chapman. Balance and SCAT-5 testinghave shown slow gradual improvement.She continuesto report recurrentheadache, emotional lability, and some occasional dizziness.Pain related to headacheis mostly localized tofront of forehead andsometimes radiating towards occipital area;mostly frontal. Shealso describes feeling sluggish most days.She does not have any known history of depression or anxiety. She is not on any chronic medications.She had a priorconcussion in high school that reportedly took around 2 months to recover from; described as more mild.  Dr. Buena Irish found patient'ssigns/symptomsas mostlyconsistent with concussion.Frontal sinus or facial fracturewere considered but determined as less likely due to normal findings on x-ray. He identifiedemotional instability as a primary  symptom, thus recommended psychological treatment in the form ofCBT.Posibility of PT and vestibular therapywas also reportedly discussed with patient.  Results of cognitive testing on 08/12/20 were generally within normal limits and consistent with predicted level of functioning. Relative weaknesses, which may represent mild decrements from premorbid functioning, were noted in basic auditory attention, vigilance, and inhibition.  Mild-moderate residual symptoms associated with postconcussional syndrome was endorsed. There was no basis for a diagnosis of a cognitive disorder. However, there did appear to be a slight (non-statistically significant) pattern of weakness suggesting left frontal area might be more impacted given mildly impaired fine motor coordination in dominant hand (Rt.), decreased lexical fluency, and basic attention. Nevertheless, the constellation of subjective cognitive complaints, physiological symptoms and emotional changes following her concussion was determined consistent with a diagnosis of postconcussional syndrome (PCS). While her neuropsychological performance did not necessarily rule out the possibility of some mild and/or transient cognitive problems, it did provide strong evidence against any very significant impairment. Symptoms of anxiety and depression were thought to be contributing to functional cognitive complaints to a mild-moderate degree. Education regarding PCS and interventions for mood and vestibular function were recommended.   Suicidal/Homicidal: Nowithout intent/plan  Therapist Response: Set agenda. Reviewed symptoms and adjustment. Provided psychoeducation on PCS and explained rational for CBT. introduced cognitive model and taught relaxation techniques (e.g., diaphragmatic/deep breathing and progressive muscle relaxation). Began formulating treatment goals. Assigned homework.   Plan: Return again in 2 weeks. Continue CBT targeted at postconcussional syndrome  symptoms. Currently focusing on identifying triggers for depression and anxiety as well as teaching healthy coping mechanisms and lifestyle modifications to manage stress and support mental health.   Diagnosis:  Post-concussional syndrome   Adjustment disorder with mixed anxiety and depression    Alfonso Ellis 09/18/2020

## 2020-10-01 ENCOUNTER — Encounter: Payer: Managed Care, Other (non HMO) | Admitting: Psychology

## 2020-10-15 ENCOUNTER — Other Ambulatory Visit: Payer: Self-pay

## 2020-10-15 ENCOUNTER — Encounter: Payer: Managed Care, Other (non HMO) | Attending: Psychology | Admitting: Psychology

## 2020-10-15 ENCOUNTER — Encounter: Payer: Self-pay | Admitting: Psychology

## 2020-10-15 DIAGNOSIS — F4323 Adjustment disorder with mixed anxiety and depressed mood: Secondary | ICD-10-CM | POA: Diagnosis present

## 2020-10-15 DIAGNOSIS — F0781 Postconcussional syndrome: Secondary | ICD-10-CM | POA: Diagnosis not present

## 2020-10-21 NOTE — Progress Notes (Signed)
THERAPIST PROGRESS NOTE  Name: Susan Matthews  DOS: 10/15/20 Session # 2 Start Time: 3:00 PM End Time:  4:00 PM  Subjective:    Patient ID: Susan Matthews is a 20 y.o. female.   Chief Complaint:   Postconcussional syndrome, adjustment disorder with mixed anxiety and depression  HPI:  Susan Matthews is a 20 y.o. female who presents with post-concussional syndrome and adjustment disorder with mixed anxiety and depression.   She was hit in the face/head with softball bat that slipped from the hands of an unknown individual while swinging during a game; she was spectator near [employer].Lost 2 front teeth. Subsequentsurgical reimplantation.X-ray of face performed at dentistdid not show fracture.In the days following, she developed headache, nausea and some dizziness. 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 Micheline Chapman. Balance and SCAT-5 testinghave shown slow gradual improvement.She continuesto report recurrentheadache, emotional lability, and some occasional dizziness.Pain related to headacheis mostly localized tofront of forehead andsometimes radiating towards occipital area;mostly frontal. Shealso describes feeling sluggish most days.She does not have any known history of depression or anxiety. She is not on any chronic medications.She had a priorconcussion in high school that reportedly took around 2 months to recover from; described as more mild.  The following portions of the patient's history were reviewed and updated as appropriate: allergies, current medications, past family history, past medical history, past social history, past surgical history and problem list. Review of Systems   Patient mentioned that she has been having a recurring nightmare where she relives the events of her  recent head injury (I.e., hit in the head with softball bat) but believes she might die; wakes up in distress with panic like symptoms.   She continues to report visual changes and discomfort when using contact lenses.   Objective:  Physical Exam Psychiatric:        Attention and Perception: Perception normal. She is inattentive. She does not perceive auditory or visual hallucinations.        Mood and Affect: Mood is anxious. Mood is not depressed or elated. Affect is not labile, blunt, flat, angry, tearful or inappropriate.        Speech: She is communicative. Speech is tangential. Speech is not rapid and pressured, delayed or slurred.        Behavior: Behavior normal. Behavior is not agitated, slowed, aggressive, withdrawn, hyperactive or combative. Behavior is cooperative.        Thought Content: Thought content normal. Thought content is not paranoid or delusional. Thought content does not include homicidal or suicidal ideation. Thought content does not include homicidal or suicidal plan.        Cognition and Memory: Cognition and memory normal. Cognition is not impaired. Memory is not impaired. She does not exhibit impaired recent memory or impaired remote memory.        Judgment: Judgment normal. Judgment is not impulsive or inappropriate.   Lab Review:  not applicable  Assessment:   Post-concussional syndrome  Adjustment disorder with mixed anxiety and depressed mood   Intervention: CBT   Participation: Alert and active   Effectiveness/Progress: Good progress towards meeting goals.   Therapist Response:  Set agenda. Reviewed homework, PCS symptoms, and overall adjustment. Reviewed cognitive model and common distortions/biases. Explored triggers for anxiety. Inquired about physical symptoms, thoughts, and coping behaviors (e.g., attempts to control worry). Introduced Adult nurse. Practiced relaxation  techniques (e.g., diaphragmatic/deep breathing and progressive muscle  relaxation). Assigned homework. Encouraged her to contact optometrist and receive updated eye exam. Provided contact information for local ophthalmologist as well for her to contact; may benefit from vision therapy and/or targeted visual (oculomotor) exercises.  Homework: Compliant with previous assignment. Agreed to practice relaxation techniques for around 10-15 minutes daily and review printed handout on common cognitive biases.   Plan:   We have 5 more scheduled biweekly therapy appointments to continue CBT targeting postconcussional symptoms. We are working on identifying triggers for anxiety and depression as well as teaching healthy coping mechanisms and lifestyle modifications to manage stress and support mental health.   Next scheduled appointment: 10/29/20

## 2020-10-29 ENCOUNTER — Encounter (HOSPITAL_BASED_OUTPATIENT_CLINIC_OR_DEPARTMENT_OTHER): Payer: Managed Care, Other (non HMO) | Admitting: Psychology

## 2020-10-29 ENCOUNTER — Other Ambulatory Visit: Payer: Self-pay

## 2020-10-29 DIAGNOSIS — F4323 Adjustment disorder with mixed anxiety and depressed mood: Secondary | ICD-10-CM

## 2020-10-29 DIAGNOSIS — F0781 Postconcussional syndrome: Secondary | ICD-10-CM

## 2020-10-29 NOTE — Progress Notes (Addendum)
THERAPY PROGRESS NOTE  Session: 2 Time: 60 min.  Subjective:    Patient ID: Susan Matthews is a 20 y.o. female.  Chief Complaint: Post-concussional syndrome and adjustment disorder with mixed anxiety and depression.   HPI (see neuropsychological evaluation note dated in EMR 08/20/21)  The following portions of the patient's history were reviewed and updated as appropriate: allergies, current medications, past family history, past medical history, past social history, past surgical history and problem list. Review of Systems   Has appointment with [facility] 11/05/20 due to residual visual problems, especially when wearing contacts. Recently noticed a lump on her chest. Ultrasound showed cyst. Worries about possibility of breast cancer; has family history. Breathing exercises have been helping to relax and stay present. Managing school demands better than expected. Headaches occur around 1-2 week. Fatigues easily   Recurrent dream/nightmare of getting hit in head with bat (e.g., wakes up in hospital about to flat line) has stopped since sharing and processing it last visit.   Objective:  Physical Exam Psychiatric:        Attention and Perception: She is inattentive. She does not perceive auditory or visual hallucinations.        Mood and Affect: Mood is anxious and depressed (mild). Mood is not elated. Affect is not labile, blunt, flat, angry, tearful or inappropriate.        Speech: She is communicative. Speech is tangential. Speech is not rapid and pressured, delayed or slurred.        Behavior: Behavior is agitated (easily frusterated). Behavior is not slowed (relatively ), aggressive, withdrawn, hyperactive or combative. Behavior is cooperative.        Thought Content: Thought content normal. Thought content is not paranoid or delusional. Thought content does not include homicidal or suicidal ideation. Thought content does not include homicidal or suicidal  plan.        Cognition and Memory: Cognition and memory normal. Cognition is not impaired. Memory is not impaired. She does not exhibit impaired recent memory or impaired remote memory.        Judgment: Judgment is impulsive. Judgment is not inappropriate.   Lab Review:  not applicable  Assessment:   Post-concussional syndrome  Adjustment disorder with mixed anxiety and depressed mood   Type of Therapy: Health Behavioral Intervention    Therapy Goals Addressed: Anxiety, Coping & Diagnosis: PCS and adjustment disorder with mixed anxiety and depression  Intervention(s): Mindfulness based CBT   Participation Level: Active and Appropriate  Behavioral Response: Casual, Guarded and Well Groomed. Alert and fully oriented. Anxious and dysphoric mood  Effectiveness/Progress: Good and Appropriate; Improving towards meeting goals. Compliant with all homework assignments thus far and appears motivated for therapy.   Therapist Response: Set agenda. Reviewed and discussed homework. Assessed PCS symptoms and adjustment, including with returning to school; 2 virtual classes. Reviewed educational material discussed last visit on PCS and repeated rational for CBT. Reviewed cognitive model and relaxation techniques (e.g., diaphragmatic/deep breathing and progressive muscle relaxation).  Led 10 minute mindfulness exercise designed to build awareness and self-monitoring skills to identify and separate judgemental thoughts from her experience. Assigned homework.    Goals:   Promote functional improvement  Minimize psychological and psychosocial barriers to recovery  Reduce excessive worrying   Aid with management of and improved coping with PCS.  Improve time management  Increase participation in pleasurable activities    Plan:   Return in 2 weeks to continue mindfulness based CBT to  improve PCS symptoms and adjustment. Continuing to identify triggers for depression, anxiety, and excessive  worrying as well as teaching therapeutic skills to manage stress and academic demands.   Billing/Service Summary:  [phone] (Health behavior intervention, individual, face-to-face; initial 30 minutes) [phone] (Health behavior intervention, individual, face-to-face; each additional 15 minutes) x 2

## 2020-11-05 ENCOUNTER — Encounter: Payer: Self-pay | Admitting: Psychology

## 2020-11-07 DIAGNOSIS — D242 Benign neoplasm of left breast: Secondary | ICD-10-CM | POA: Diagnosis not present

## 2020-11-12 ENCOUNTER — Encounter: Payer: BC Managed Care – PPO | Admitting: Psychology

## 2020-11-26 ENCOUNTER — Encounter: Payer: BC Managed Care – PPO | Admitting: Psychology

## 2020-12-03 DIAGNOSIS — N12 Tubulo-interstitial nephritis, not specified as acute or chronic: Secondary | ICD-10-CM | POA: Diagnosis not present

## 2020-12-03 DIAGNOSIS — N390 Urinary tract infection, site not specified: Secondary | ICD-10-CM | POA: Diagnosis not present

## 2020-12-10 ENCOUNTER — Encounter: Payer: BC Managed Care – PPO | Admitting: Psychology

## 2020-12-24 ENCOUNTER — Encounter: Payer: BC Managed Care – PPO | Admitting: Psychology

## 2021-01-02 DIAGNOSIS — F4323 Adjustment disorder with mixed anxiety and depressed mood: Secondary | ICD-10-CM | POA: Diagnosis not present

## 2021-01-02 DIAGNOSIS — F32A Depression, unspecified: Secondary | ICD-10-CM | POA: Diagnosis not present

## 2021-01-02 DIAGNOSIS — F419 Anxiety disorder, unspecified: Secondary | ICD-10-CM | POA: Diagnosis not present

## 2021-01-14 ENCOUNTER — Encounter (HOSPITAL_BASED_OUTPATIENT_CLINIC_OR_DEPARTMENT_OTHER): Payer: Self-pay | Admitting: Plastic Surgery

## 2021-01-14 ENCOUNTER — Other Ambulatory Visit: Payer: Self-pay

## 2021-01-15 DIAGNOSIS — D242 Benign neoplasm of left breast: Secondary | ICD-10-CM | POA: Diagnosis not present

## 2021-01-15 NOTE — H&P (Signed)
Subjective:     Patient ID: Susan Matthews is a 20 y.o. female.  HPI  Here for follow up discussion prior to excuision left breast mass first noted over year ago. Reports continued slow growth. Desires excision. Returns with mother.  Underwent US bilateral breasts 2022. Right breast adjacent benign simple cysts at 2 o clock 4cmfn noted, largest cysts in this location measure approximately 9 x 6 x 8 mm and 9 x 9 x 7 mm. Right breast 6 o clock, appr 2 cmfn normal fibroglandular tissue present. Over left breast mass with macrolobular margins at 12 o clock, 6 cmfn present measuring 2.4x 1.2 x 2.4 cm, likely fibroadenoma present.   US guided biopsy for left breast mass 12 o clock, 6 cmfn demonstrating fibroadenoma, no atypia, hydromark clip in mass.  FH- reports MA and MGGM with breast ca.  Current "medium" in sports bras. Reports left larger volume with growth mass. Wt stable  [employer], desires to go into Nutritional therapist. PMH includes concussion from softball bat fall 2021, continues in therapy for post concussive symptoms including visual symptoms and adjustment disorder with mixed anxiety and depression.Living on campus currently, family in [place].  Review of Systems  Eyes: Positive for visual disturbance.  Psychiatric/Behavioral: The patient is nervous/anxious.   Remainder 12 point review negative    Objective:   Physical Exam Cardiovascular:     Rate and Rhythm: Normal rate and regular rhythm.     Heart sounds: Normal heart sounds.  Pulmonary:     Effort: Pulmonary effort is normal.     Breath sounds: Normal breath sounds.  Chest:  Breasts:     Right: No axillary adenopathy.     Left: No axillary adenopathy.    Lymphadenopathy:     Upper Body:     Right upper body: No axillary adenopathy.     Left upper body: No axillary adenopathy.  Skin:    Comments: Fitzpatrick 1     Breasts:  SN to nipple R 23 L 23 cm BW R 16 L 16 cm Nipple to IMF R 9  L 8 cm Over right breast multiple nodular areas present Left breast with palpable mass left superior pole breast     Assessment:     Fibroadenoma left breast    Plan:      Plan excision left breast mass.  Benign nature diagnosis discussed. Options include observation vs excision. Patient reports has discussed with parents and desires excision. Reviewed given location plan direct scar over mass. This scar likely will be visible in clothing or bathing suits. Reviewed OP surgery. Reviewed risks new lesions, additional procedures pending pathology.   Additional risks including but not limited to scar bleeding hematoma seroma recurrence need for additional procedures unacceptable cosmetic result blood clots in legs or lungs.  Rx for tramadol given.  Discussed risk COVID infectionthrough this elective surgery. Patient will receive COVID testing prior to surgery. Discussed even if patient receivesa negative test result, the tests in some cases may fail to detect the virus or patient maycontract COVID after the test.COVID 19 infectionbefore/during/aftersurgery may result in lead to a higher chance of complication and death.

## 2021-01-17 DIAGNOSIS — F4323 Adjustment disorder with mixed anxiety and depressed mood: Secondary | ICD-10-CM | POA: Diagnosis not present

## 2021-01-20 ENCOUNTER — Other Ambulatory Visit (HOSPITAL_COMMUNITY): Payer: BC Managed Care – PPO

## 2021-01-21 ENCOUNTER — Ambulatory Visit (HOSPITAL_BASED_OUTPATIENT_CLINIC_OR_DEPARTMENT_OTHER): Payer: BC Managed Care – PPO | Admitting: Certified Registered"

## 2021-01-21 ENCOUNTER — Encounter (HOSPITAL_BASED_OUTPATIENT_CLINIC_OR_DEPARTMENT_OTHER)
Admission: RE | Disposition: A | Discharge status: Home / Self Care | Payer: Self-pay | Source: Home / Self Care | Attending: Plastic Surgery

## 2021-01-21 ENCOUNTER — Other Ambulatory Visit: Payer: Self-pay

## 2021-01-21 ENCOUNTER — Ambulatory Visit (HOSPITAL_BASED_OUTPATIENT_CLINIC_OR_DEPARTMENT_OTHER)
Admission: RE | Admit: 2021-01-21 | Discharge: 2021-01-21 | Disposition: A | Discharge status: Home / Self Care | Payer: BC Managed Care – PPO | Attending: Plastic Surgery | Admitting: Plastic Surgery

## 2021-01-21 ENCOUNTER — Encounter (HOSPITAL_BASED_OUTPATIENT_CLINIC_OR_DEPARTMENT_OTHER): Payer: Self-pay | Admitting: Plastic Surgery

## 2021-01-21 DIAGNOSIS — N6022 Fibroadenosis of left breast: Secondary | ICD-10-CM | POA: Diagnosis not present

## 2021-01-21 DIAGNOSIS — Z803 Family history of malignant neoplasm of breast: Secondary | ICD-10-CM | POA: Insufficient documentation

## 2021-01-21 DIAGNOSIS — D242 Benign neoplasm of left breast: Secondary | ICD-10-CM | POA: Insufficient documentation

## 2021-01-21 DIAGNOSIS — F418 Other specified anxiety disorders: Secondary | ICD-10-CM | POA: Diagnosis not present

## 2021-01-21 HISTORY — DX: Depression, unspecified: F32.A

## 2021-01-21 HISTORY — DX: Postconcussional syndrome: F07.81

## 2021-01-21 HISTORY — PX: MASS EXCISION: SHX2000

## 2021-01-21 HISTORY — DX: Anxiety disorder, unspecified: F41.9

## 2021-01-21 LAB — POCT PREGNANCY, URINE: Preg Test, Ur: NEGATIVE

## 2021-01-21 SURGERY — EXCISION MASS
Anesthesia: General | Site: Breast | Laterality: Left

## 2021-01-21 MED ORDER — PROMETHAZINE HCL 25 MG/ML IJ SOLN: 6.2500 mg | INTRAMUSCULAR | Status: DC | PRN

## 2021-01-21 MED ORDER — PHENYLEPHRINE HCL (PRESSORS) 10 MG/ML IV SOLN
INTRAVENOUS | Status: DC | PRN
  Administered 2021-01-21: 40 ug via INTRAVENOUS
  Administered 2021-01-21: 80 ug via INTRAVENOUS

## 2021-01-21 MED ORDER — OXYCODONE HCL 5 MG/5ML PO SOLN: 5.0000 mg | Freq: Once | ORAL | Status: AC | PRN

## 2021-01-21 MED ORDER — CEFAZOLIN SODIUM-DEXTROSE 2-4 GM/100ML-% IV SOLN
2.0000 g | INTRAVENOUS | Status: AC
  Administered 2021-01-21: 2 g via INTRAVENOUS

## 2021-01-21 MED ORDER — CHLORHEXIDINE GLUCONATE CLOTH 2 % EX PADS: 6.0000 | MEDICATED_PAD | Freq: Once | CUTANEOUS | Status: DC

## 2021-01-21 MED ORDER — MIDAZOLAM HCL 5 MG/5ML IJ SOLN
INTRAMUSCULAR | Status: DC | PRN
  Administered 2021-01-21: 2 mg via INTRAVENOUS

## 2021-01-21 MED ORDER — GABAPENTIN 300 MG PO CAPS
ORAL_CAPSULE | ORAL | Status: AC
  Filled 2021-01-21: qty 1

## 2021-01-21 MED ORDER — DEXMEDETOMIDINE (PRECEDEX) IN NS 20 MCG/5ML (4 MCG/ML) IV SYRINGE
PREFILLED_SYRINGE | INTRAVENOUS | Status: DC | PRN
  Administered 2021-01-21: 8 ug via INTRAVENOUS

## 2021-01-21 MED ORDER — FENTANYL CITRATE (PF) 100 MCG/2ML IJ SOLN
INTRAMUSCULAR | Status: DC | PRN
  Administered 2021-01-21: 100 ug via INTRAVENOUS

## 2021-01-21 MED ORDER — LIDOCAINE HCL (CARDIAC) PF 100 MG/5ML IV SOSY
PREFILLED_SYRINGE | INTRAVENOUS | Status: DC | PRN
  Administered 2021-01-21: 50 mg via INTRAVENOUS

## 2021-01-21 MED ORDER — GABAPENTIN 300 MG PO CAPS
300.0000 mg | ORAL_CAPSULE | ORAL | Status: AC
  Administered 2021-01-21: 300 mg via ORAL

## 2021-01-21 MED ORDER — MEPERIDINE HCL 25 MG/ML IJ SOLN: 6.2500 mg | INTRAMUSCULAR | Status: DC | PRN

## 2021-01-21 MED ORDER — FENTANYL CITRATE (PF) 100 MCG/2ML IJ SOLN
INTRAMUSCULAR | Status: AC
  Filled 2021-01-21: qty 2

## 2021-01-21 MED ORDER — HYDROMORPHONE HCL 1 MG/ML IJ SOLN
INTRAMUSCULAR | Status: AC
  Filled 2021-01-21: qty 0.5

## 2021-01-21 MED ORDER — CELECOXIB 200 MG PO CAPS
ORAL_CAPSULE | ORAL | Status: AC
  Filled 2021-01-21: qty 1

## 2021-01-21 MED ORDER — OXYCODONE HCL 5 MG PO TABS
ORAL_TABLET | ORAL | Status: AC
  Filled 2021-01-21: qty 1

## 2021-01-21 MED ORDER — MIDAZOLAM HCL 2 MG/2ML IJ SOLN
INTRAMUSCULAR | Status: AC
  Filled 2021-01-21: qty 2

## 2021-01-21 MED ORDER — SCOPOLAMINE 1 MG/3DAYS TD PT72
MEDICATED_PATCH | TRANSDERMAL | Status: AC
  Filled 2021-01-21: qty 1

## 2021-01-21 MED ORDER — PROPOFOL 500 MG/50ML IV EMUL
INTRAVENOUS | Status: DC | PRN
  Administered 2021-01-21: 25 ug/kg/min via INTRAVENOUS

## 2021-01-21 MED ORDER — HYDROMORPHONE HCL 1 MG/ML IJ SOLN
0.2500 mg | INTRAMUSCULAR | Status: DC | PRN
  Administered 2021-01-21 (×2): 0.5 mg via INTRAVENOUS

## 2021-01-21 MED ORDER — CELECOXIB 200 MG PO CAPS
200.0000 mg | ORAL_CAPSULE | ORAL | Status: AC
  Administered 2021-01-21: 200 mg via ORAL

## 2021-01-21 MED ORDER — KETOROLAC TROMETHAMINE 30 MG/ML IJ SOLN
INTRAMUSCULAR | Status: AC
  Filled 2021-01-21: qty 1

## 2021-01-21 MED ORDER — BUPIVACAINE HCL (PF) 0.5 % IJ SOLN
INTRAMUSCULAR | Status: DC | PRN
  Administered 2021-01-21: 15 mL

## 2021-01-21 MED ORDER — BUPIVACAINE HCL (PF) 0.5 % IJ SOLN
INTRAMUSCULAR | Status: AC
  Filled 2021-01-21: qty 30

## 2021-01-21 MED ORDER — ACETAMINOPHEN 500 MG PO TABS
1000.0000 mg | ORAL_TABLET | ORAL | Status: AC
  Administered 2021-01-21: 1000 mg via ORAL

## 2021-01-21 MED ORDER — OXYCODONE HCL 5 MG PO TABS
5.0000 mg | ORAL_TABLET | Freq: Once | ORAL | Status: AC | PRN
  Administered 2021-01-21: 5 mg via ORAL

## 2021-01-21 MED ORDER — ACETAMINOPHEN 500 MG PO TABS
ORAL_TABLET | ORAL | Status: AC
  Filled 2021-01-21: qty 2

## 2021-01-21 MED ORDER — KETOROLAC TROMETHAMINE 30 MG/ML IJ SOLN
30.0000 mg | Freq: Once | INTRAMUSCULAR | Status: AC | PRN
  Administered 2021-01-21: 30 mg via INTRAVENOUS

## 2021-01-21 MED ORDER — PROPOFOL 10 MG/ML IV BOLUS
INTRAVENOUS | Status: DC | PRN
  Administered 2021-01-21: 200 mg via INTRAVENOUS

## 2021-01-21 MED ORDER — SCOPOLAMINE 1 MG/3DAYS TD PT72
1.0000 | MEDICATED_PATCH | TRANSDERMAL | Status: DC
  Administered 2021-01-21: 1.5 mg via TRANSDERMAL

## 2021-01-21 MED ORDER — LACTATED RINGERS IV SOLN: INTRAVENOUS | Status: DC

## 2021-01-21 MED ORDER — CEFAZOLIN SODIUM-DEXTROSE 2-4 GM/100ML-% IV SOLN
INTRAVENOUS | Status: AC
  Filled 2021-01-21: qty 100

## 2021-01-21 SURGICAL SUPPLY — 67 items
ADH SKN CLS APL DERMABOND .7 (GAUZE/BANDAGES/DRESSINGS)
APL PRP STRL LF DISP 70% ISPRP (MISCELLANEOUS) ×1
APL SKNCLS STERI-STRIP NONHPOA (GAUZE/BANDAGES/DRESSINGS)
BAND INSRT 18 STRL LF DISP RB (MISCELLANEOUS)
BAND RUBBER #18 3X1/16 STRL (MISCELLANEOUS) IMPLANT
BENZOIN TINCTURE PRP APPL 2/3 (GAUZE/BANDAGES/DRESSINGS) IMPLANT
BLADE CLIPPER SURG (BLADE) IMPLANT
BLADE SURG 11 STRL SS (BLADE) IMPLANT
BLADE SURG 15 STRL LF DISP TIS (BLADE) ×1 IMPLANT
BLADE SURG 15 STRL SS (BLADE) ×2
CANISTER SUCT 1200ML W/VALVE (MISCELLANEOUS) IMPLANT
CHLORAPREP W/TINT 26 (MISCELLANEOUS) ×2 IMPLANT
COVER BACK TABLE 60X90IN (DRAPES) ×2 IMPLANT
COVER MAYO STAND STRL (DRAPES) ×2 IMPLANT
COVER WAND RF STERILE (DRAPES) IMPLANT
DERMABOND ADVANCED (GAUZE/BANDAGES/DRESSINGS)
DERMABOND ADVANCED .7 DNX12 (GAUZE/BANDAGES/DRESSINGS) IMPLANT
DRAIN JP 10F RND SILICONE (MISCELLANEOUS) IMPLANT
DRAPE LAPAROTOMY 100X72 PEDS (DRAPES) IMPLANT
DRAPE U-SHAPE 76X120 STRL (DRAPES) IMPLANT
DRAPE UTILITY XL STRL (DRAPES) ×2 IMPLANT
DRSG TELFA 3X8 NADH (GAUZE/BANDAGES/DRESSINGS) IMPLANT
ELECT COATED BLADE 2.86 ST (ELECTRODE) IMPLANT
ELECT NDL BLADE 2-5/6 (NEEDLE) ×1 IMPLANT
ELECT NEEDLE BLADE 2-5/6 (NEEDLE) ×2 IMPLANT
ELECT REM PT RETURN 9FT ADLT (ELECTROSURGICAL)
ELECT REM PT RETURN 9FT PED (ELECTROSURGICAL)
ELECTRODE REM PT RETRN 9FT PED (ELECTROSURGICAL) IMPLANT
ELECTRODE REM PT RTRN 9FT ADLT (ELECTROSURGICAL) IMPLANT
EVACUATOR SILICONE 100CC (DRAIN) IMPLANT
GAUZE SPONGE 4X4 12PLY STRL LF (GAUZE/BANDAGES/DRESSINGS) IMPLANT
GAUZE XEROFORM 1X8 LF (GAUZE/BANDAGES/DRESSINGS) IMPLANT
GLOVE SURG HYDRASOFT LTX SZ5.5 (GLOVE) ×2 IMPLANT
GOWN STRL REUS W/ TWL LRG LVL3 (GOWN DISPOSABLE) ×2 IMPLANT
GOWN STRL REUS W/TWL LRG LVL3 (GOWN DISPOSABLE) ×4
NDL HYPO 30GX1 BEV (NEEDLE) IMPLANT
NDL PRECISIONGLIDE 27X1.5 (NEEDLE) ×1 IMPLANT
NEEDLE HYPO 30GX1 BEV (NEEDLE) IMPLANT
NEEDLE PRECISIONGLIDE 27X1.5 (NEEDLE) ×2 IMPLANT
NS IRRIG 1000ML POUR BTL (IV SOLUTION) IMPLANT
PACK BASIN DAY SURGERY FS (CUSTOM PROCEDURE TRAY) ×2 IMPLANT
PAD DRESSING TELFA 3X8 NADH (GAUZE/BANDAGES/DRESSINGS) IMPLANT
PENCIL SMOKE EVACUATOR (MISCELLANEOUS) ×2 IMPLANT
SHEET MEDIUM DRAPE 40X70 STRL (DRAPES) IMPLANT
SLEEVE SCD COMPRESS KNEE MED (STOCKING) IMPLANT
SPONGE GAUZE 2X2 8PLY STRL LF (GAUZE/BANDAGES/DRESSINGS) IMPLANT
SPONGE LAP 18X18 RF (DISPOSABLE) IMPLANT
STAPLER VISISTAT 35W (STAPLE) ×2 IMPLANT
STRIP CLOSURE SKIN 1/2X4 (GAUZE/BANDAGES/DRESSINGS) IMPLANT
SUCTION FRAZIER HANDLE 10FR (MISCELLANEOUS)
SUCTION TUBE FRAZIER 10FR DISP (MISCELLANEOUS) IMPLANT
SUT ETHILON 4 0 PS 2 18 (SUTURE) IMPLANT
SUT MNCRL AB 4-0 PS2 18 (SUTURE) ×1 IMPLANT
SUT MON AB 5-0 P3 18 (SUTURE) IMPLANT
SUT PLAIN 5 0 P 3 18 (SUTURE) IMPLANT
SUT PROLENE 5 0 P 3 (SUTURE) IMPLANT
SUT PROLENE 6 0 P 1 18 (SUTURE) IMPLANT
SUT VIC AB 3-0 PS1 18 (SUTURE) ×2
SUT VIC AB 3-0 PS1 18XBRD (SUTURE) IMPLANT
SUT VICRYL 4-0 PS2 18IN ABS (SUTURE) ×1 IMPLANT
SWAB COLLECTION DEVICE MRSA (MISCELLANEOUS) IMPLANT
SWAB CULTURE ESWAB REG 1ML (MISCELLANEOUS) IMPLANT
SYR BULB EAR ULCER 3OZ GRN STR (SYRINGE) IMPLANT
SYR CONTROL 10ML LL (SYRINGE) ×2 IMPLANT
TOWEL GREEN STERILE FF (TOWEL DISPOSABLE) ×2 IMPLANT
TRAY DSU PREP LF (CUSTOM PROCEDURE TRAY) IMPLANT
TUBE CONNECTING 20X1/4 (TUBING) IMPLANT

## 2021-01-21 NOTE — Anesthesia Preprocedure Evaluation (Addendum)
Anesthesia Evaluation  Patient identified by MRN, date of birth, ID band Patient awake    Reviewed: Allergy & Precautions, NPO status , Patient's Chart, lab work & pertinent test results  Airway Mallampati: I  TM Distance: >3 FB Neck ROM: Full    Dental  (+) Teeth Intact, Dental Advisory Given,    Pulmonary asthma ,    Pulmonary exam normal breath sounds clear to auscultation       Cardiovascular negative cardio ROS Normal cardiovascular exam Rhythm:Regular Rate:Normal     Neuro/Psych PSYCHIATRIC DISORDERS Anxiety Depression negative neurological ROS     GI/Hepatic negative GI ROS, Neg liver ROS,   Endo/Other  negative endocrine ROS  Renal/GU negative Renal ROS  negative genitourinary   Musculoskeletal negative musculoskeletal ROS (+)   Abdominal   Peds negative pediatric ROS (+)  Hematology negative hematology ROS (+)   Anesthesia Other Findings Fibroadenoma L breast   Reproductive/Obstetrics negative OB ROS                            Anesthesia Physical Anesthesia Plan  ASA: I  Anesthesia Plan: General   Post-op Pain Management:    Induction: Intravenous  PONV Risk Score and Plan: 4 or greater and Ondansetron, Dexamethasone, Midazolam, Treatment may vary due to age or medical condition and Scopolamine patch - Pre-op  Airway Management Planned: LMA  Additional Equipment: None  Intra-op Plan:   Post-operative Plan: Extubation in OR  Informed Consent: I have reviewed the patients History and Physical, chart, labs and discussed the procedure including the risks, benefits and alternatives for the proposed anesthesia with the patient or authorized representative who has indicated his/her understanding and acceptance.     Dental advisory given  Plan Discussed with: CRNA  Anesthesia Plan Comments:         Anesthesia Quick Evaluation

## 2021-01-21 NOTE — Op Note (Signed)
Operative Note   DATE OF OPERATION: 5.17.22  [facility]  SURGICAL DIVISION: Plastic Surgery  PREOPERATIVE DIAGNOSES:  1. Fibroadenoma left breast  POSTOPERATIVE DIAGNOSES:  same  PROCEDURE:  Excision fibroadenoma left breast  SURGEON: Irene Limbo MD MBA  ASSISTANT: none  ANESTHESIA:  General  EBL: 5 ml  COMPLICATIONS: None immediate.   INDICATIONS FOR PROCEDURE:  The patient, Susan Matthews, is a 20 y.o. female born on [birth year], is here for excision biopsy proven fibroadenoma left breast.   FINDINGS: Palpable mass excised and biopsy clip within specimen confirmed on Faxitron.  DESCRIPTION OF PROCEDURE:  The patient's operative site was marked with the patient in the preoperative area. The patient was taken to the operating room. SCDs were placed and IV antibiotics were given. The patient's operative site was prepped and draped in a sterile fashion. A time out was performed and all information was confirmed to be correct. Local anesthetic infiltrated surrounding palpable mass. Incision made over palpable mass. Skin flaps elevated. Mass excised with margins. Wound irrigated and hemostasis obtained. Additional local anesthetic infiltrated. Closure wound completed with 3-0 vicryl for approximation cavity. 3-0 vicryl then used to close superficial fascia, 4-0 vicryl in dermis and 4-0 monocryl subcuticular closure. Dermabond applied.   The patient was allowed to wake from anesthesia, extubated and taken to the recovery room in satisfactory condition.   SPECIMENS: left breast fibroadenoma  DRAINS: none

## 2021-01-21 NOTE — Anesthesia Postprocedure Evaluation (Signed)
Anesthesia Post Note  Patient: Susan Matthews  Procedure(s) Performed: EXCISION FIBROADENOMA LEFT BREAST (Left Breast)     Patient location during evaluation: PACU Anesthesia Type: General Level of consciousness: awake and alert, oriented and patient cooperative Pain management: pain level controlled Vital Signs Assessment: post-procedure vital signs reviewed and stable Respiratory status: spontaneous breathing, nonlabored ventilation and respiratory function stable Cardiovascular status: blood pressure returned to baseline and stable Postop Assessment: no apparent nausea or vomiting Anesthetic complications: no   No complications documented.  Last Vitals:  Vitals:   01/21/21 0915 01/21/21 0942  BP: 124/76 (!) 142/87  Pulse: 89 (!) 110  Resp: 12 15  Temp:  36.7 C  SpO2: 96% 100%    Last Pain:  Vitals:   01/21/21 0929  TempSrc:   PainSc: [place]

## 2021-01-21 NOTE — Anesthesia Procedure Notes (Signed)
Procedure Name: LMA Insertion Date/Time: 01/21/2021 7:21 AM Performed by: Signe Colt, CRNA Pre-anesthesia Checklist: Patient identified, Emergency Drugs available, Suction available and Patient being monitored Patient Re-evaluated:Patient Re-evaluated prior to induction Oxygen Delivery Method: Circle System Utilized Preoxygenation: Pre-oxygenation with 100% oxygen Induction Type: IV induction Ventilation: Mask ventilation without difficulty LMA: LMA inserted LMA Size: 4.0 Number of attempts: 1 Airway Equipment and Method: bite block Placement Confirmation: positive ETCO2 Tube secured with: Tape Dental Injury: Teeth and Oropharynx as per pre-operative assessment

## 2021-01-21 NOTE — Transfer of Care (Signed)
Immediate Anesthesia Transfer of Care Note  Patient: [name]  Procedure(s) Performed: EXCISION FIBROADENOMA LEFT BREAST (Left Breast)  Patient Location: PACU  Anesthesia Type:General  Level of Consciousness: drowsy and patient cooperative  Airway & Oxygen Therapy: Patient Spontanous Breathing and Patient connected to face mask oxygen  Post-op Assessment: Report given to RN and Post -op Vital signs reviewed and stable  Post vital signs: Reviewed and stable  Last Vitals:  Vitals Value Taken Time  BP    Temp    Pulse 109 01/21/21 0813  Resp 26 01/21/21 0813  SpO2 100 % 01/21/21 0813  Vitals shown include unvalidated device data.  Last Pain:  Vitals:   01/21/21 0701  TempSrc: Oral  PainSc: 0-No pain         Complications: No complications documented.

## 2021-01-21 NOTE — Discharge Instructions (Signed)
No Tylenol until after 1pm today if needed No ibuprofen until after 3pm today if needed You had 5mg  of Oxycodone at 9:33AM.  Post Anesthesia Home Care Instructions  Activity: Get plenty of rest for the remainder of the day. A responsible individual must stay with you for 24 hours following the procedure.  For the next 24 hours, DO NOT: -Drive a car -Paediatric nurse -Drink alcoholic beverages -Take any medication unless instructed by your physician -Make any legal decisions or sign important papers.  Meals: Start with liquid foods such as gelatin or soup. Progress to regular foods as tolerated. Avoid greasy, spicy, heavy foods. If nausea and/or vomiting occur, drink only clear liquids until the nausea and/or vomiting subsides. Call your physician if vomiting continues.  Special Instructions/Symptoms: Your throat may feel dry or sore from the anesthesia or the breathing tube placed in your throat during surgery. If this causes discomfort, gargle with warm salt water. The discomfort should disappear within 24 hours.  If you had a scopolamine patch placed behind your ear for the management of post- operative nausea and/or vomiting:  1. The medication in the patch is effective for 72 hours, after which it should be removed.  Wrap patch in a tissue and discard in the trash. Wash hands thoroughly with soap and water. 2. You may remove the patch earlier than 72 hours if you experience unpleasant side effects which may include dry mouth, dizziness or visual disturbances. 3. Avoid touching the patch. Wash your hands with soap and water after contact with the patch.

## 2021-01-21 NOTE — Interval H&P Note (Signed)
History and Physical Interval Note:  01/21/2021 6:56 AM  Susan Matthews  has presented today for surgery, with the diagnosis of FIBROADENOMA LEFT BREAST.  The various methods of treatment have been discussed with the patient and family. After consideration of risks, benefits and other options for treatment, the patient has consented to  Procedure(s): EXCISION FIBROADENOMA LEFT BREAST (Left) as a surgical intervention.  The patient's history has been reviewed, patient examined, no change in status, stable for surgery.  I have reviewed the patient's chart and labs.  Questions were answered to the patient's satisfaction.     Arnoldo Hooker Terrelle Ruffolo

## 2021-01-22 LAB — SURGICAL PATHOLOGY

## 2021-01-24 ENCOUNTER — Encounter (HOSPITAL_BASED_OUTPATIENT_CLINIC_OR_DEPARTMENT_OTHER): Payer: Self-pay | Admitting: Plastic Surgery

## 2021-03-07 DIAGNOSIS — J00 Acute nasopharyngitis [common cold]: Secondary | ICD-10-CM | POA: Diagnosis not present

## 2021-04-10 DIAGNOSIS — N63 Unspecified lump in unspecified breast: Secondary | ICD-10-CM | POA: Diagnosis not present

## 2021-04-10 DIAGNOSIS — F419 Anxiety disorder, unspecified: Secondary | ICD-10-CM | POA: Diagnosis not present

## 2021-05-07 DIAGNOSIS — N6312 Unspecified lump in the right breast, upper inner quadrant: Secondary | ICD-10-CM | POA: Diagnosis not present

## 2021-05-07 DIAGNOSIS — N6012 Diffuse cystic mastopathy of left breast: Secondary | ICD-10-CM | POA: Diagnosis not present

## 2021-05-07 DIAGNOSIS — N6011 Diffuse cystic mastopathy of right breast: Secondary | ICD-10-CM | POA: Diagnosis not present

## 2021-05-07 DIAGNOSIS — N6322 Unspecified lump in the left breast, upper inner quadrant: Secondary | ICD-10-CM | POA: Diagnosis not present

## 2021-07-02 DIAGNOSIS — Z20828 Contact with and (suspected) exposure to other viral communicable diseases: Secondary | ICD-10-CM | POA: Diagnosis not present

## 2021-07-02 DIAGNOSIS — J029 Acute pharyngitis, unspecified: Secondary | ICD-10-CM | POA: Diagnosis not present

## 2021-07-02 DIAGNOSIS — J020 Streptococcal pharyngitis: Secondary | ICD-10-CM | POA: Diagnosis not present

## 2021-09-07 DIAGNOSIS — S20211A Contusion of right front wall of thorax, initial encounter: Secondary | ICD-10-CM | POA: Diagnosis not present

## 2021-09-07 DIAGNOSIS — R0781 Pleurodynia: Secondary | ICD-10-CM | POA: Diagnosis not present

## 2021-09-07 DIAGNOSIS — R1012 Left upper quadrant pain: Secondary | ICD-10-CM | POA: Diagnosis not present

## 2021-09-18 DIAGNOSIS — F419 Anxiety disorder, unspecified: Secondary | ICD-10-CM | POA: Diagnosis not present

## 2021-11-06 DIAGNOSIS — M9905 Segmental and somatic dysfunction of pelvic region: Secondary | ICD-10-CM | POA: Diagnosis not present

## 2021-11-06 DIAGNOSIS — M7610 Psoas tendinitis, unspecified hip: Secondary | ICD-10-CM | POA: Diagnosis not present

## 2021-11-06 DIAGNOSIS — M461 Sacroiliitis, not elsewhere classified: Secondary | ICD-10-CM | POA: Diagnosis not present

## 2021-11-06 DIAGNOSIS — M9903 Segmental and somatic dysfunction of lumbar region: Secondary | ICD-10-CM | POA: Diagnosis not present

## 2021-11-27 DIAGNOSIS — M9903 Segmental and somatic dysfunction of lumbar region: Secondary | ICD-10-CM | POA: Diagnosis not present

## 2021-11-27 DIAGNOSIS — M7610 Psoas tendinitis, unspecified hip: Secondary | ICD-10-CM | POA: Diagnosis not present

## 2021-11-27 DIAGNOSIS — M461 Sacroiliitis, not elsewhere classified: Secondary | ICD-10-CM | POA: Diagnosis not present

## 2021-11-27 DIAGNOSIS — M9905 Segmental and somatic dysfunction of pelvic region: Secondary | ICD-10-CM | POA: Diagnosis not present

## 2021-12-01 DIAGNOSIS — M79632 Pain in left forearm: Secondary | ICD-10-CM | POA: Diagnosis not present

## 2021-12-01 DIAGNOSIS — S5402XA Injury of ulnar nerve at forearm level, left arm, initial encounter: Secondary | ICD-10-CM | POA: Diagnosis not present

## 2021-12-02 DIAGNOSIS — M7610 Psoas tendinitis, unspecified hip: Secondary | ICD-10-CM | POA: Diagnosis not present

## 2021-12-02 DIAGNOSIS — M9905 Segmental and somatic dysfunction of pelvic region: Secondary | ICD-10-CM | POA: Diagnosis not present

## 2021-12-02 DIAGNOSIS — M461 Sacroiliitis, not elsewhere classified: Secondary | ICD-10-CM | POA: Diagnosis not present

## 2021-12-02 DIAGNOSIS — M9903 Segmental and somatic dysfunction of lumbar region: Secondary | ICD-10-CM | POA: Diagnosis not present

## 2021-12-15 DIAGNOSIS — M79632 Pain in left forearm: Secondary | ICD-10-CM | POA: Diagnosis not present

## 2021-12-15 DIAGNOSIS — S63592A Other specified sprain of left wrist, initial encounter: Secondary | ICD-10-CM | POA: Diagnosis not present

## 2022-01-06 DIAGNOSIS — L559 Sunburn, unspecified: Secondary | ICD-10-CM | POA: Diagnosis not present

## 2022-01-06 DIAGNOSIS — M79606 Pain in leg, unspecified: Secondary | ICD-10-CM | POA: Diagnosis not present

## 2022-01-06 DIAGNOSIS — R21 Rash and other nonspecific skin eruption: Secondary | ICD-10-CM | POA: Diagnosis not present

## 2022-03-23 DIAGNOSIS — H6692 Otitis media, unspecified, left ear: Secondary | ICD-10-CM | POA: Diagnosis not present

## 2022-05-18 DIAGNOSIS — F419 Anxiety disorder, unspecified: Secondary | ICD-10-CM | POA: Diagnosis not present

## 2022-05-18 DIAGNOSIS — L209 Atopic dermatitis, unspecified: Secondary | ICD-10-CM | POA: Diagnosis not present

## 2022-05-18 DIAGNOSIS — Z1331 Encounter for screening for depression: Secondary | ICD-10-CM | POA: Diagnosis not present

## 2022-05-18 DIAGNOSIS — Z0000 Encounter for general adult medical examination without abnormal findings: Secondary | ICD-10-CM | POA: Diagnosis not present

## 2022-09-14 DIAGNOSIS — L209 Atopic dermatitis, unspecified: Secondary | ICD-10-CM | POA: Diagnosis not present

## 2022-09-14 DIAGNOSIS — L299 Pruritus, unspecified: Secondary | ICD-10-CM | POA: Diagnosis not present

## 2022-09-14 DIAGNOSIS — D485 Neoplasm of uncertain behavior of skin: Secondary | ICD-10-CM | POA: Diagnosis not present

## 2022-10-12 DIAGNOSIS — M25511 Pain in right shoulder: Secondary | ICD-10-CM | POA: Diagnosis not present

## 2022-11-11 DIAGNOSIS — M25511 Pain in right shoulder: Secondary | ICD-10-CM | POA: Diagnosis not present

## 2022-11-18 DIAGNOSIS — M25511 Pain in right shoulder: Secondary | ICD-10-CM | POA: Diagnosis not present

## 2022-11-20 DIAGNOSIS — M25511 Pain in right shoulder: Secondary | ICD-10-CM | POA: Diagnosis not present

## 2022-12-28 DIAGNOSIS — S43431A Superior glenoid labrum lesion of right shoulder, initial encounter: Secondary | ICD-10-CM | POA: Diagnosis not present

## 2022-12-28 DIAGNOSIS — X58XXXA Exposure to other specified factors, initial encounter: Secondary | ICD-10-CM | POA: Diagnosis not present

## 2022-12-28 DIAGNOSIS — Y999 Unspecified external cause status: Secondary | ICD-10-CM | POA: Diagnosis not present

## 2022-12-28 DIAGNOSIS — G8918 Other acute postprocedural pain: Secondary | ICD-10-CM | POA: Diagnosis not present

## 2022-12-28 DIAGNOSIS — M24111 Other articular cartilage disorders, right shoulder: Secondary | ICD-10-CM | POA: Diagnosis not present

## 2023-01-01 DIAGNOSIS — R531 Weakness: Secondary | ICD-10-CM | POA: Diagnosis not present

## 2023-01-01 DIAGNOSIS — M25611 Stiffness of right shoulder, not elsewhere classified: Secondary | ICD-10-CM | POA: Diagnosis not present

## 2023-01-01 DIAGNOSIS — M25511 Pain in right shoulder: Secondary | ICD-10-CM | POA: Diagnosis not present

## 2023-01-05 DIAGNOSIS — M24111 Other articular cartilage disorders, right shoulder: Secondary | ICD-10-CM | POA: Diagnosis not present

## 2023-01-06 DIAGNOSIS — R531 Weakness: Secondary | ICD-10-CM | POA: Diagnosis not present

## 2023-01-06 DIAGNOSIS — M25611 Stiffness of right shoulder, not elsewhere classified: Secondary | ICD-10-CM | POA: Diagnosis not present

## 2023-01-06 DIAGNOSIS — M25511 Pain in right shoulder: Secondary | ICD-10-CM | POA: Diagnosis not present

## 2023-01-08 DIAGNOSIS — M25611 Stiffness of right shoulder, not elsewhere classified: Secondary | ICD-10-CM | POA: Diagnosis not present

## 2023-01-08 DIAGNOSIS — M25511 Pain in right shoulder: Secondary | ICD-10-CM | POA: Diagnosis not present

## 2023-01-08 DIAGNOSIS — R531 Weakness: Secondary | ICD-10-CM | POA: Diagnosis not present

## 2023-01-13 DIAGNOSIS — M25511 Pain in right shoulder: Secondary | ICD-10-CM | POA: Diagnosis not present

## 2023-01-13 DIAGNOSIS — R531 Weakness: Secondary | ICD-10-CM | POA: Diagnosis not present

## 2023-01-13 DIAGNOSIS — M25611 Stiffness of right shoulder, not elsewhere classified: Secondary | ICD-10-CM | POA: Diagnosis not present

## 2023-01-18 DIAGNOSIS — M25611 Stiffness of right shoulder, not elsewhere classified: Secondary | ICD-10-CM | POA: Diagnosis not present

## 2023-01-18 DIAGNOSIS — R531 Weakness: Secondary | ICD-10-CM | POA: Diagnosis not present

## 2023-01-18 DIAGNOSIS — M25511 Pain in right shoulder: Secondary | ICD-10-CM | POA: Diagnosis not present

## 2023-01-20 DIAGNOSIS — M25611 Stiffness of right shoulder, not elsewhere classified: Secondary | ICD-10-CM | POA: Diagnosis not present

## 2023-01-20 DIAGNOSIS — R531 Weakness: Secondary | ICD-10-CM | POA: Diagnosis not present

## 2023-01-20 DIAGNOSIS — M25511 Pain in right shoulder: Secondary | ICD-10-CM | POA: Diagnosis not present

## 2023-01-26 DIAGNOSIS — M24111 Other articular cartilage disorders, right shoulder: Secondary | ICD-10-CM | POA: Diagnosis not present

## 2023-01-27 DIAGNOSIS — R531 Weakness: Secondary | ICD-10-CM | POA: Diagnosis not present

## 2023-01-27 DIAGNOSIS — M25611 Stiffness of right shoulder, not elsewhere classified: Secondary | ICD-10-CM | POA: Diagnosis not present

## 2023-01-27 DIAGNOSIS — M25511 Pain in right shoulder: Secondary | ICD-10-CM | POA: Diagnosis not present

## 2023-02-03 DIAGNOSIS — M25511 Pain in right shoulder: Secondary | ICD-10-CM | POA: Diagnosis not present

## 2023-02-03 DIAGNOSIS — M25611 Stiffness of right shoulder, not elsewhere classified: Secondary | ICD-10-CM | POA: Diagnosis not present

## 2023-02-03 DIAGNOSIS — R531 Weakness: Secondary | ICD-10-CM | POA: Diagnosis not present

## 2023-02-08 DIAGNOSIS — R531 Weakness: Secondary | ICD-10-CM | POA: Diagnosis not present

## 2023-02-08 DIAGNOSIS — M25611 Stiffness of right shoulder, not elsewhere classified: Secondary | ICD-10-CM | POA: Diagnosis not present

## 2023-02-08 DIAGNOSIS — M25511 Pain in right shoulder: Secondary | ICD-10-CM | POA: Diagnosis not present

## 2023-02-10 DIAGNOSIS — R531 Weakness: Secondary | ICD-10-CM | POA: Diagnosis not present

## 2023-02-10 DIAGNOSIS — M25611 Stiffness of right shoulder, not elsewhere classified: Secondary | ICD-10-CM | POA: Diagnosis not present

## 2023-02-10 DIAGNOSIS — M25511 Pain in right shoulder: Secondary | ICD-10-CM | POA: Diagnosis not present

## 2023-02-15 DIAGNOSIS — M25611 Stiffness of right shoulder, not elsewhere classified: Secondary | ICD-10-CM | POA: Diagnosis not present

## 2023-02-15 DIAGNOSIS — M25511 Pain in right shoulder: Secondary | ICD-10-CM | POA: Diagnosis not present

## 2023-02-15 DIAGNOSIS — R531 Weakness: Secondary | ICD-10-CM | POA: Diagnosis not present

## 2023-02-17 DIAGNOSIS — R531 Weakness: Secondary | ICD-10-CM | POA: Diagnosis not present

## 2023-02-17 DIAGNOSIS — M25511 Pain in right shoulder: Secondary | ICD-10-CM | POA: Diagnosis not present

## 2023-02-17 DIAGNOSIS — M25611 Stiffness of right shoulder, not elsewhere classified: Secondary | ICD-10-CM | POA: Diagnosis not present

## 2023-02-22 DIAGNOSIS — Z9889 Other specified postprocedural states: Secondary | ICD-10-CM | POA: Diagnosis not present

## 2023-02-22 DIAGNOSIS — M25611 Stiffness of right shoulder, not elsewhere classified: Secondary | ICD-10-CM | POA: Diagnosis not present

## 2023-02-22 DIAGNOSIS — M25511 Pain in right shoulder: Secondary | ICD-10-CM | POA: Diagnosis not present

## 2023-02-22 DIAGNOSIS — L659 Nonscarring hair loss, unspecified: Secondary | ICD-10-CM | POA: Diagnosis not present

## 2023-02-22 DIAGNOSIS — R531 Weakness: Secondary | ICD-10-CM | POA: Diagnosis not present

## 2023-02-24 DIAGNOSIS — E559 Vitamin D deficiency, unspecified: Secondary | ICD-10-CM | POA: Diagnosis not present

## 2023-02-24 DIAGNOSIS — M25511 Pain in right shoulder: Secondary | ICD-10-CM | POA: Diagnosis not present

## 2023-02-24 DIAGNOSIS — M25611 Stiffness of right shoulder, not elsewhere classified: Secondary | ICD-10-CM | POA: Diagnosis not present

## 2023-02-24 DIAGNOSIS — R531 Weakness: Secondary | ICD-10-CM | POA: Diagnosis not present

## 2023-02-24 DIAGNOSIS — L659 Nonscarring hair loss, unspecified: Secondary | ICD-10-CM | POA: Diagnosis not present

## 2023-03-01 DIAGNOSIS — M25511 Pain in right shoulder: Secondary | ICD-10-CM | POA: Diagnosis not present

## 2023-03-01 DIAGNOSIS — M25611 Stiffness of right shoulder, not elsewhere classified: Secondary | ICD-10-CM | POA: Diagnosis not present

## 2023-03-01 DIAGNOSIS — R531 Weakness: Secondary | ICD-10-CM | POA: Diagnosis not present

## 2023-03-03 DIAGNOSIS — M25511 Pain in right shoulder: Secondary | ICD-10-CM | POA: Diagnosis not present

## 2023-03-03 DIAGNOSIS — R531 Weakness: Secondary | ICD-10-CM | POA: Diagnosis not present

## 2023-03-03 DIAGNOSIS — M25611 Stiffness of right shoulder, not elsewhere classified: Secondary | ICD-10-CM | POA: Diagnosis not present

## 2023-03-08 DIAGNOSIS — R531 Weakness: Secondary | ICD-10-CM | POA: Diagnosis not present

## 2023-03-08 DIAGNOSIS — M25511 Pain in right shoulder: Secondary | ICD-10-CM | POA: Diagnosis not present

## 2023-03-08 DIAGNOSIS — M25611 Stiffness of right shoulder, not elsewhere classified: Secondary | ICD-10-CM | POA: Diagnosis not present

## 2023-03-09 DIAGNOSIS — M24111 Other articular cartilage disorders, right shoulder: Secondary | ICD-10-CM | POA: Diagnosis not present

## 2023-03-15 DIAGNOSIS — M25511 Pain in right shoulder: Secondary | ICD-10-CM | POA: Diagnosis not present

## 2023-03-15 DIAGNOSIS — R531 Weakness: Secondary | ICD-10-CM | POA: Diagnosis not present

## 2023-03-15 DIAGNOSIS — M25611 Stiffness of right shoulder, not elsewhere classified: Secondary | ICD-10-CM | POA: Diagnosis not present

## 2023-03-17 DIAGNOSIS — M25611 Stiffness of right shoulder, not elsewhere classified: Secondary | ICD-10-CM | POA: Diagnosis not present

## 2023-03-17 DIAGNOSIS — R531 Weakness: Secondary | ICD-10-CM | POA: Diagnosis not present

## 2023-03-17 DIAGNOSIS — M25511 Pain in right shoulder: Secondary | ICD-10-CM | POA: Diagnosis not present

## 2023-03-22 DIAGNOSIS — M25511 Pain in right shoulder: Secondary | ICD-10-CM | POA: Diagnosis not present

## 2023-03-22 DIAGNOSIS — R531 Weakness: Secondary | ICD-10-CM | POA: Diagnosis not present

## 2023-03-22 DIAGNOSIS — M25611 Stiffness of right shoulder, not elsewhere classified: Secondary | ICD-10-CM | POA: Diagnosis not present

## 2023-03-26 DIAGNOSIS — L650 Telogen effluvium: Secondary | ICD-10-CM | POA: Diagnosis not present

## 2023-03-26 DIAGNOSIS — E559 Vitamin D deficiency, unspecified: Secondary | ICD-10-CM | POA: Diagnosis not present

## 2023-03-31 DIAGNOSIS — M25511 Pain in right shoulder: Secondary | ICD-10-CM | POA: Diagnosis not present

## 2023-03-31 DIAGNOSIS — M25611 Stiffness of right shoulder, not elsewhere classified: Secondary | ICD-10-CM | POA: Diagnosis not present

## 2023-03-31 DIAGNOSIS — R531 Weakness: Secondary | ICD-10-CM | POA: Diagnosis not present

## 2023-04-01 DIAGNOSIS — M25511 Pain in right shoulder: Secondary | ICD-10-CM | POA: Diagnosis not present

## 2023-04-01 DIAGNOSIS — M25611 Stiffness of right shoulder, not elsewhere classified: Secondary | ICD-10-CM | POA: Diagnosis not present

## 2023-04-01 DIAGNOSIS — R531 Weakness: Secondary | ICD-10-CM | POA: Diagnosis not present

## 2023-04-02 DIAGNOSIS — Z411 Encounter for cosmetic surgery: Secondary | ICD-10-CM | POA: Diagnosis not present

## 2023-04-06 DIAGNOSIS — R531 Weakness: Secondary | ICD-10-CM | POA: Diagnosis not present

## 2023-04-06 DIAGNOSIS — M25511 Pain in right shoulder: Secondary | ICD-10-CM | POA: Diagnosis not present

## 2023-04-06 DIAGNOSIS — M25611 Stiffness of right shoulder, not elsewhere classified: Secondary | ICD-10-CM | POA: Diagnosis not present

## 2023-04-08 DIAGNOSIS — R531 Weakness: Secondary | ICD-10-CM | POA: Diagnosis not present

## 2023-04-08 DIAGNOSIS — M25611 Stiffness of right shoulder, not elsewhere classified: Secondary | ICD-10-CM | POA: Diagnosis not present

## 2023-04-08 DIAGNOSIS — M25511 Pain in right shoulder: Secondary | ICD-10-CM | POA: Diagnosis not present

## 2023-04-13 DIAGNOSIS — M25511 Pain in right shoulder: Secondary | ICD-10-CM | POA: Diagnosis not present

## 2023-04-13 DIAGNOSIS — R531 Weakness: Secondary | ICD-10-CM | POA: Diagnosis not present

## 2023-04-13 DIAGNOSIS — M25611 Stiffness of right shoulder, not elsewhere classified: Secondary | ICD-10-CM | POA: Diagnosis not present

## 2023-04-14 DIAGNOSIS — R531 Weakness: Secondary | ICD-10-CM | POA: Diagnosis not present

## 2023-04-14 DIAGNOSIS — M25611 Stiffness of right shoulder, not elsewhere classified: Secondary | ICD-10-CM | POA: Diagnosis not present

## 2023-04-14 DIAGNOSIS — M25511 Pain in right shoulder: Secondary | ICD-10-CM | POA: Diagnosis not present

## 2023-04-20 DIAGNOSIS — M24111 Other articular cartilage disorders, right shoulder: Secondary | ICD-10-CM | POA: Diagnosis not present

## 2023-04-22 DIAGNOSIS — R531 Weakness: Secondary | ICD-10-CM | POA: Diagnosis not present

## 2023-04-22 DIAGNOSIS — M25611 Stiffness of right shoulder, not elsewhere classified: Secondary | ICD-10-CM | POA: Diagnosis not present

## 2023-04-22 DIAGNOSIS — M25511 Pain in right shoulder: Secondary | ICD-10-CM | POA: Diagnosis not present

## 2023-04-27 DIAGNOSIS — R531 Weakness: Secondary | ICD-10-CM | POA: Diagnosis not present

## 2023-04-27 DIAGNOSIS — M25611 Stiffness of right shoulder, not elsewhere classified: Secondary | ICD-10-CM | POA: Diagnosis not present

## 2023-04-27 DIAGNOSIS — M25511 Pain in right shoulder: Secondary | ICD-10-CM | POA: Diagnosis not present

## 2023-04-30 DIAGNOSIS — R531 Weakness: Secondary | ICD-10-CM | POA: Diagnosis not present

## 2023-04-30 DIAGNOSIS — M25511 Pain in right shoulder: Secondary | ICD-10-CM | POA: Diagnosis not present

## 2023-04-30 DIAGNOSIS — M25611 Stiffness of right shoulder, not elsewhere classified: Secondary | ICD-10-CM | POA: Diagnosis not present

## 2023-05-04 DIAGNOSIS — M25511 Pain in right shoulder: Secondary | ICD-10-CM | POA: Diagnosis not present

## 2023-05-04 DIAGNOSIS — R531 Weakness: Secondary | ICD-10-CM | POA: Diagnosis not present

## 2023-05-04 DIAGNOSIS — M25611 Stiffness of right shoulder, not elsewhere classified: Secondary | ICD-10-CM | POA: Diagnosis not present

## 2023-05-12 DIAGNOSIS — M25611 Stiffness of right shoulder, not elsewhere classified: Secondary | ICD-10-CM | POA: Diagnosis not present

## 2023-05-12 DIAGNOSIS — M25511 Pain in right shoulder: Secondary | ICD-10-CM | POA: Diagnosis not present

## 2023-05-12 DIAGNOSIS — R531 Weakness: Secondary | ICD-10-CM | POA: Diagnosis not present

## 2023-05-18 DIAGNOSIS — M24111 Other articular cartilage disorders, right shoulder: Secondary | ICD-10-CM | POA: Diagnosis not present

## 2023-05-20 DIAGNOSIS — M25611 Stiffness of right shoulder, not elsewhere classified: Secondary | ICD-10-CM | POA: Diagnosis not present

## 2023-05-20 DIAGNOSIS — M25511 Pain in right shoulder: Secondary | ICD-10-CM | POA: Diagnosis not present

## 2023-05-20 DIAGNOSIS — R531 Weakness: Secondary | ICD-10-CM | POA: Diagnosis not present

## 2023-05-25 DIAGNOSIS — M25611 Stiffness of right shoulder, not elsewhere classified: Secondary | ICD-10-CM | POA: Diagnosis not present

## 2023-05-25 DIAGNOSIS — M25511 Pain in right shoulder: Secondary | ICD-10-CM | POA: Diagnosis not present

## 2023-05-25 DIAGNOSIS — R531 Weakness: Secondary | ICD-10-CM | POA: Diagnosis not present

## 2023-05-28 DIAGNOSIS — R531 Weakness: Secondary | ICD-10-CM | POA: Diagnosis not present

## 2023-05-28 DIAGNOSIS — M25511 Pain in right shoulder: Secondary | ICD-10-CM | POA: Diagnosis not present

## 2023-05-28 DIAGNOSIS — M25611 Stiffness of right shoulder, not elsewhere classified: Secondary | ICD-10-CM | POA: Diagnosis not present

## 2023-06-03 DIAGNOSIS — Z7182 Exercise counseling: Secondary | ICD-10-CM | POA: Diagnosis not present

## 2023-06-03 DIAGNOSIS — Z1331 Encounter for screening for depression: Secondary | ICD-10-CM | POA: Diagnosis not present

## 2023-06-03 DIAGNOSIS — Z713 Dietary counseling and surveillance: Secondary | ICD-10-CM | POA: Diagnosis not present

## 2023-06-03 DIAGNOSIS — Z0000 Encounter for general adult medical examination without abnormal findings: Secondary | ICD-10-CM | POA: Diagnosis not present

## 2023-06-03 DIAGNOSIS — Z6821 Body mass index (BMI) 21.0-21.9, adult: Secondary | ICD-10-CM | POA: Diagnosis not present

## 2023-06-18 DIAGNOSIS — Z113 Encounter for screening for infections with a predominantly sexual mode of transmission: Secondary | ICD-10-CM | POA: Diagnosis not present

## 2023-06-18 DIAGNOSIS — Z01419 Encounter for gynecological examination (general) (routine) without abnormal findings: Secondary | ICD-10-CM | POA: Diagnosis not present

## 2023-09-27 DIAGNOSIS — Z13220 Encounter for screening for lipoid disorders: Secondary | ICD-10-CM | POA: Diagnosis not present

## 2023-10-05 DIAGNOSIS — L659 Nonscarring hair loss, unspecified: Secondary | ICD-10-CM | POA: Diagnosis not present

## 2023-11-18 DIAGNOSIS — L245 Irritant contact dermatitis due to other chemical products: Secondary | ICD-10-CM | POA: Diagnosis not present

## 2023-11-18 DIAGNOSIS — L299 Pruritus, unspecified: Secondary | ICD-10-CM | POA: Diagnosis not present

## 2024-01-11 DIAGNOSIS — M25511 Pain in right shoulder: Secondary | ICD-10-CM | POA: Diagnosis not present

## 2024-01-28 DIAGNOSIS — E778 Other disorders of glycoprotein metabolism: Secondary | ICD-10-CM | POA: Diagnosis not present

## 2024-01-28 DIAGNOSIS — R7989 Other specified abnormal findings of blood chemistry: Secondary | ICD-10-CM | POA: Diagnosis not present

## 2024-02-02 DIAGNOSIS — R7989 Other specified abnormal findings of blood chemistry: Secondary | ICD-10-CM | POA: Diagnosis not present

## 2024-02-03 DIAGNOSIS — R7989 Other specified abnormal findings of blood chemistry: Secondary | ICD-10-CM | POA: Diagnosis not present

## 2024-02-16 DIAGNOSIS — N946 Dysmenorrhea, unspecified: Secondary | ICD-10-CM | POA: Diagnosis not present

## 2024-02-16 DIAGNOSIS — R5383 Other fatigue: Secondary | ICD-10-CM | POA: Diagnosis not present

## 2024-02-16 DIAGNOSIS — D519 Vitamin B12 deficiency anemia, unspecified: Secondary | ICD-10-CM | POA: Diagnosis not present

## 2024-02-16 DIAGNOSIS — L659 Nonscarring hair loss, unspecified: Secondary | ICD-10-CM | POA: Diagnosis not present

## 2024-03-07 DIAGNOSIS — M25511 Pain in right shoulder: Secondary | ICD-10-CM | POA: Diagnosis not present

## 2024-03-15 DIAGNOSIS — L659 Nonscarring hair loss, unspecified: Secondary | ICD-10-CM | POA: Diagnosis not present

## 2024-03-15 DIAGNOSIS — D72829 Elevated white blood cell count, unspecified: Secondary | ICD-10-CM | POA: Diagnosis not present

## 2024-03-15 DIAGNOSIS — N946 Dysmenorrhea, unspecified: Secondary | ICD-10-CM | POA: Diagnosis not present

## 2024-03-15 DIAGNOSIS — M25511 Pain in right shoulder: Secondary | ICD-10-CM | POA: Diagnosis not present

## 2024-03-15 DIAGNOSIS — D519 Vitamin B12 deficiency anemia, unspecified: Secondary | ICD-10-CM | POA: Diagnosis not present

## 2024-03-21 DIAGNOSIS — M25511 Pain in right shoulder: Secondary | ICD-10-CM | POA: Diagnosis not present

## 2024-05-16 DIAGNOSIS — Z0000 Encounter for general adult medical examination without abnormal findings: Secondary | ICD-10-CM | POA: Diagnosis not present

## 2024-05-16 DIAGNOSIS — D519 Vitamin B12 deficiency anemia, unspecified: Secondary | ICD-10-CM | POA: Diagnosis not present

## 2024-05-16 DIAGNOSIS — D72829 Elevated white blood cell count, unspecified: Secondary | ICD-10-CM | POA: Diagnosis not present

## 2024-05-16 DIAGNOSIS — L659 Nonscarring hair loss, unspecified: Secondary | ICD-10-CM | POA: Diagnosis not present

## 2024-07-14 DIAGNOSIS — Z3041 Encounter for surveillance of contraceptive pills: Secondary | ICD-10-CM | POA: Diagnosis not present

## 2024-07-14 DIAGNOSIS — N898 Other specified noninflammatory disorders of vagina: Secondary | ICD-10-CM | POA: Diagnosis not present

## 2024-07-14 DIAGNOSIS — Z01419 Encounter for gynecological examination (general) (routine) without abnormal findings: Secondary | ICD-10-CM | POA: Diagnosis not present

## 2024-08-15 DIAGNOSIS — N946 Dysmenorrhea, unspecified: Secondary | ICD-10-CM | POA: Diagnosis not present

## 2024-08-15 DIAGNOSIS — L659 Nonscarring hair loss, unspecified: Secondary | ICD-10-CM | POA: Diagnosis not present

## 2024-08-15 DIAGNOSIS — D519 Vitamin B12 deficiency anemia, unspecified: Secondary | ICD-10-CM | POA: Diagnosis not present

## 2024-08-15 DIAGNOSIS — D72829 Elevated white blood cell count, unspecified: Secondary | ICD-10-CM | POA: Diagnosis not present

## 2024-09-04 DIAGNOSIS — D519 Vitamin B12 deficiency anemia, unspecified: Secondary | ICD-10-CM | POA: Diagnosis not present

## 2024-09-04 DIAGNOSIS — D72829 Elevated white blood cell count, unspecified: Secondary | ICD-10-CM | POA: Diagnosis not present

## 2024-09-04 DIAGNOSIS — J101 Influenza due to other identified influenza virus with other respiratory manifestations: Secondary | ICD-10-CM | POA: Diagnosis not present

## 2024-09-04 DIAGNOSIS — L659 Nonscarring hair loss, unspecified: Secondary | ICD-10-CM | POA: Diagnosis not present
